# Patient Record
Sex: Female | Born: 1955 | Hispanic: No | Marital: Married | Smoking: Never smoker
Health system: Southern US, Community
[De-identification: ages and names within clinical notes are randomized; demographics above are authoritative.]

## PROBLEM LIST (undated history)

## (undated) DIAGNOSIS — E785 Hyperlipidemia, unspecified: Secondary | ICD-10-CM

## (undated) DIAGNOSIS — I10 Essential (primary) hypertension: Secondary | ICD-10-CM

## (undated) DIAGNOSIS — M549 Dorsalgia, unspecified: Secondary | ICD-10-CM

## (undated) DIAGNOSIS — R87629 Unspecified abnormal cytological findings in specimens from vagina: Secondary | ICD-10-CM

## (undated) DIAGNOSIS — F32A Depression, unspecified: Secondary | ICD-10-CM

## (undated) DIAGNOSIS — G8929 Other chronic pain: Secondary | ICD-10-CM

## (undated) DIAGNOSIS — F329 Major depressive disorder, single episode, unspecified: Secondary | ICD-10-CM

## (undated) HISTORY — PX: TUBAL LIGATION: SHX77

## (undated) HISTORY — DX: Hyperlipidemia, unspecified: E78.5

## (undated) HISTORY — PX: COLPOSCOPY: SHX161

## (undated) HISTORY — PX: THYROID CYST EXCISION: SHX2511

## (undated) HISTORY — DX: Unspecified abnormal cytological findings in specimens from vagina: R87.629

---

## 2003-12-30 ENCOUNTER — Emergency Department (HOSPITAL_COMMUNITY): Admission: EM | Admit: 2003-12-30 | Discharge: 2003-12-30 | Payer: Self-pay | Admitting: Emergency Medicine

## 2005-01-30 ENCOUNTER — Ambulatory Visit: Payer: Self-pay | Admitting: Family Medicine

## 2005-02-08 ENCOUNTER — Ambulatory Visit: Payer: Self-pay | Admitting: *Deleted

## 2005-03-13 ENCOUNTER — Ambulatory Visit: Payer: Self-pay | Admitting: Family Medicine

## 2005-03-29 ENCOUNTER — Ambulatory Visit: Payer: Self-pay | Admitting: Family Medicine

## 2005-06-29 ENCOUNTER — Emergency Department (HOSPITAL_COMMUNITY): Admission: EM | Admit: 2005-06-29 | Discharge: 2005-06-29 | Payer: Self-pay | Admitting: Emergency Medicine

## 2006-01-26 ENCOUNTER — Ambulatory Visit: Payer: Self-pay | Admitting: Family Medicine

## 2006-02-15 ENCOUNTER — Ambulatory Visit: Payer: Self-pay | Admitting: Family Medicine

## 2006-02-22 ENCOUNTER — Ambulatory Visit (HOSPITAL_COMMUNITY): Admission: RE | Admit: 2006-02-22 | Discharge: 2006-02-22 | Payer: Self-pay | Admitting: Family Medicine

## 2006-03-15 ENCOUNTER — Ambulatory Visit: Payer: Self-pay | Admitting: Family Medicine

## 2006-05-10 ENCOUNTER — Ambulatory Visit: Payer: Self-pay | Admitting: Family Medicine

## 2006-05-21 ENCOUNTER — Ambulatory Visit (HOSPITAL_COMMUNITY): Admission: RE | Admit: 2006-05-21 | Discharge: 2006-05-21 | Payer: Self-pay | Admitting: Internal Medicine

## 2006-06-15 ENCOUNTER — Ambulatory Visit (HOSPITAL_COMMUNITY): Admission: RE | Admit: 2006-06-15 | Discharge: 2006-06-15 | Payer: Self-pay | Admitting: Internal Medicine

## 2006-07-13 ENCOUNTER — Ambulatory Visit (HOSPITAL_COMMUNITY): Admission: RE | Admit: 2006-07-13 | Discharge: 2006-07-13 | Payer: Self-pay | Admitting: Internal Medicine

## 2006-09-19 ENCOUNTER — Emergency Department (HOSPITAL_COMMUNITY): Admission: EM | Admit: 2006-09-19 | Discharge: 2006-09-19 | Payer: Self-pay | Admitting: Family Medicine

## 2007-03-06 ENCOUNTER — Emergency Department (HOSPITAL_COMMUNITY): Admission: EM | Admit: 2007-03-06 | Discharge: 2007-03-06 | Payer: Self-pay | Admitting: Emergency Medicine

## 2007-06-05 ENCOUNTER — Emergency Department (HOSPITAL_COMMUNITY): Admission: EM | Admit: 2007-06-05 | Discharge: 2007-06-06 | Payer: Self-pay | Admitting: Emergency Medicine

## 2007-06-24 ENCOUNTER — Emergency Department (HOSPITAL_COMMUNITY): Admission: EM | Admit: 2007-06-24 | Discharge: 2007-06-24 | Payer: Self-pay | Admitting: Family Medicine

## 2007-07-04 ENCOUNTER — Ambulatory Visit: Payer: Self-pay | Admitting: Cardiovascular Disease

## 2007-07-16 ENCOUNTER — Ambulatory Visit: Payer: Self-pay

## 2007-07-16 ENCOUNTER — Ambulatory Visit: Payer: Self-pay | Admitting: Family Medicine

## 2007-07-16 ENCOUNTER — Encounter: Payer: Self-pay | Admitting: Cardiovascular Disease

## 2007-07-16 ENCOUNTER — Encounter (INDEPENDENT_AMBULATORY_CARE_PROVIDER_SITE_OTHER): Payer: Self-pay | Admitting: Family Medicine

## 2007-07-16 LAB — CONVERTED CEMR LAB
Chlamydia, DNA Probe: NEGATIVE
GC Probe Amp, Genital: NEGATIVE

## 2007-07-19 ENCOUNTER — Telehealth (INDEPENDENT_AMBULATORY_CARE_PROVIDER_SITE_OTHER): Payer: Self-pay | Admitting: Family Medicine

## 2007-07-19 DIAGNOSIS — I10 Essential (primary) hypertension: Secondary | ICD-10-CM

## 2007-07-19 DIAGNOSIS — E785 Hyperlipidemia, unspecified: Secondary | ICD-10-CM | POA: Insufficient documentation

## 2007-07-23 DIAGNOSIS — I10 Essential (primary) hypertension: Secondary | ICD-10-CM | POA: Insufficient documentation

## 2007-07-23 DIAGNOSIS — R0789 Other chest pain: Secondary | ICD-10-CM | POA: Insufficient documentation

## 2007-08-09 DIAGNOSIS — Z124 Encounter for screening for malignant neoplasm of cervix: Secondary | ICD-10-CM | POA: Insufficient documentation

## 2007-08-09 DIAGNOSIS — R87610 Atypical squamous cells of undetermined significance on cytologic smear of cervix (ASC-US): Secondary | ICD-10-CM

## 2007-08-29 ENCOUNTER — Emergency Department (HOSPITAL_COMMUNITY): Admission: EM | Admit: 2007-08-29 | Discharge: 2007-08-30 | Payer: Self-pay | Admitting: Emergency Medicine

## 2007-09-04 ENCOUNTER — Ambulatory Visit: Payer: Self-pay | Admitting: Family Medicine

## 2007-09-17 ENCOUNTER — Telehealth (INDEPENDENT_AMBULATORY_CARE_PROVIDER_SITE_OTHER): Payer: Self-pay | Admitting: *Deleted

## 2007-09-19 ENCOUNTER — Ambulatory Visit: Payer: Self-pay | Admitting: Obstetrics and Gynecology

## 2007-09-19 ENCOUNTER — Encounter (INDEPENDENT_AMBULATORY_CARE_PROVIDER_SITE_OTHER): Payer: Self-pay | Admitting: Family Medicine

## 2007-09-19 ENCOUNTER — Other Ambulatory Visit: Admission: RE | Admit: 2007-09-19 | Discharge: 2007-09-19 | Payer: Self-pay | Admitting: Obstetrics and Gynecology

## 2007-10-03 ENCOUNTER — Ambulatory Visit: Payer: Self-pay | Admitting: Obstetrics and Gynecology

## 2007-10-17 ENCOUNTER — Ambulatory Visit: Payer: Self-pay | Admitting: Obstetrics & Gynecology

## 2007-11-28 ENCOUNTER — Telehealth (INDEPENDENT_AMBULATORY_CARE_PROVIDER_SITE_OTHER): Payer: Self-pay | Admitting: *Deleted

## 2007-12-04 ENCOUNTER — Ambulatory Visit: Payer: Self-pay | Admitting: Family Medicine

## 2007-12-04 DIAGNOSIS — R51 Headache: Secondary | ICD-10-CM

## 2007-12-04 DIAGNOSIS — R519 Headache, unspecified: Secondary | ICD-10-CM | POA: Insufficient documentation

## 2008-03-18 ENCOUNTER — Ambulatory Visit: Payer: Self-pay | Admitting: Family Medicine

## 2008-03-18 ENCOUNTER — Telehealth (INDEPENDENT_AMBULATORY_CARE_PROVIDER_SITE_OTHER): Payer: Self-pay | Admitting: Family Medicine

## 2008-03-18 DIAGNOSIS — M545 Low back pain, unspecified: Secondary | ICD-10-CM | POA: Insufficient documentation

## 2008-03-24 ENCOUNTER — Encounter (INDEPENDENT_AMBULATORY_CARE_PROVIDER_SITE_OTHER): Payer: Self-pay | Admitting: Family Medicine

## 2008-04-09 ENCOUNTER — Encounter (INDEPENDENT_AMBULATORY_CARE_PROVIDER_SITE_OTHER): Payer: Self-pay | Admitting: Family Medicine

## 2008-04-09 ENCOUNTER — Ambulatory Visit: Payer: Self-pay | Admitting: Obstetrics and Gynecology

## 2008-04-09 ENCOUNTER — Encounter: Payer: Self-pay | Admitting: Obstetrics and Gynecology

## 2008-04-14 DIAGNOSIS — E210 Primary hyperparathyroidism: Secondary | ICD-10-CM | POA: Insufficient documentation

## 2008-05-22 ENCOUNTER — Telehealth (INDEPENDENT_AMBULATORY_CARE_PROVIDER_SITE_OTHER): Payer: Self-pay | Admitting: Family Medicine

## 2008-05-22 LAB — CONVERTED CEMR LAB
ALT: 16 units/L (ref 0–35)
AST: 16 units/L (ref 0–37)
Albumin: 4.4 g/dL (ref 3.5–5.2)
Alkaline Phosphatase: 84 units/L (ref 39–117)
BUN: 12 mg/dL (ref 6–23)
CO2: 24 meq/L (ref 19–32)
Calcium, Total (PTH): 11 mg/dL — ABNORMAL HIGH (ref 8.4–10.5)
Calcium: 11 mg/dL — ABNORMAL HIGH (ref 8.4–10.5)
Chloride: 106 meq/L (ref 96–112)
Creatinine, Ser: 0.85 mg/dL (ref 0.40–1.20)
Glucose, Bld: 95 mg/dL (ref 70–99)
PTH: 100.7 pg/mL — ABNORMAL HIGH (ref 14.0–72.0)
Potassium: 4.4 meq/L (ref 3.5–5.3)
Sodium: 139 meq/L (ref 135–145)
Total Bilirubin: 0.3 mg/dL (ref 0.3–1.2)
Total Protein: 7.4 g/dL (ref 6.0–8.3)
Vit D, 1,25-Dihydroxy: 22 — ABNORMAL LOW (ref 30–89)

## 2008-05-25 ENCOUNTER — Ambulatory Visit: Payer: Self-pay | Admitting: Family Medicine

## 2008-06-01 ENCOUNTER — Telehealth (INDEPENDENT_AMBULATORY_CARE_PROVIDER_SITE_OTHER): Payer: Self-pay | Admitting: *Deleted

## 2008-07-01 ENCOUNTER — Telehealth (INDEPENDENT_AMBULATORY_CARE_PROVIDER_SITE_OTHER): Payer: Self-pay | Admitting: *Deleted

## 2008-07-15 ENCOUNTER — Encounter (INDEPENDENT_AMBULATORY_CARE_PROVIDER_SITE_OTHER): Payer: Self-pay | Admitting: *Deleted

## 2008-07-16 ENCOUNTER — Emergency Department (HOSPITAL_COMMUNITY): Admission: EM | Admit: 2008-07-16 | Discharge: 2008-07-16 | Payer: Self-pay | Admitting: Emergency Medicine

## 2008-07-17 ENCOUNTER — Telehealth (INDEPENDENT_AMBULATORY_CARE_PROVIDER_SITE_OTHER): Payer: Self-pay | Admitting: *Deleted

## 2008-08-04 ENCOUNTER — Ambulatory Visit: Payer: Self-pay | Admitting: Family Medicine

## 2008-08-04 DIAGNOSIS — IMO0001 Reserved for inherently not codable concepts without codable children: Secondary | ICD-10-CM

## 2008-08-07 ENCOUNTER — Encounter (HOSPITAL_COMMUNITY): Admission: RE | Admit: 2008-08-07 | Discharge: 2008-08-07 | Payer: Self-pay | Admitting: Family Medicine

## 2008-08-10 ENCOUNTER — Telehealth (INDEPENDENT_AMBULATORY_CARE_PROVIDER_SITE_OTHER): Payer: Self-pay | Admitting: Family Medicine

## 2008-08-10 LAB — CONVERTED CEMR LAB
ALT: 17 units/L (ref 0–35)
AST: 17 units/L (ref 0–37)
Albumin: 4.6 g/dL (ref 3.5–5.2)
Alkaline Phosphatase: 77 units/L (ref 39–117)
BUN: 10 mg/dL (ref 6–23)
CO2: 23 meq/L (ref 19–32)
Calcium, Total (PTH): 11.1 mg/dL — ABNORMAL HIGH (ref 8.4–10.5)
Calcium: 11.1 mg/dL — ABNORMAL HIGH (ref 8.4–10.5)
Chloride: 107 meq/L (ref 96–112)
Creatinine, Ser: 0.64 mg/dL (ref 0.40–1.20)
Glucose, Bld: 74 mg/dL (ref 70–99)
Magnesium: 2.4 mg/dL (ref 1.5–2.5)
PTH: 131.5 pg/mL — ABNORMAL HIGH (ref 14.0–72.0)
Phosphorus: 2.7 mg/dL (ref 2.3–4.6)
Potassium: 4.9 meq/L (ref 3.5–5.3)
Sodium: 139 meq/L (ref 135–145)
Total Bilirubin: 0.5 mg/dL (ref 0.3–1.2)
Total Protein: 7.8 g/dL (ref 6.0–8.3)
Vit D, 1,25-Dihydroxy: 26 — ABNORMAL LOW (ref 30–89)

## 2008-08-16 DIAGNOSIS — F332 Major depressive disorder, recurrent severe without psychotic features: Secondary | ICD-10-CM | POA: Insufficient documentation

## 2008-08-19 ENCOUNTER — Ambulatory Visit: Payer: Self-pay | Admitting: Family Medicine

## 2008-09-09 ENCOUNTER — Encounter (INDEPENDENT_AMBULATORY_CARE_PROVIDER_SITE_OTHER): Payer: Self-pay | Admitting: Family Medicine

## 2008-10-21 ENCOUNTER — Ambulatory Visit: Payer: Self-pay | Admitting: Family Medicine

## 2008-10-28 ENCOUNTER — Telehealth (INDEPENDENT_AMBULATORY_CARE_PROVIDER_SITE_OTHER): Payer: Self-pay | Admitting: *Deleted

## 2008-11-04 ENCOUNTER — Ambulatory Visit: Payer: Self-pay | Admitting: Family Medicine

## 2008-11-04 DIAGNOSIS — R1032 Left lower quadrant pain: Secondary | ICD-10-CM

## 2008-11-05 ENCOUNTER — Encounter (INDEPENDENT_AMBULATORY_CARE_PROVIDER_SITE_OTHER): Payer: Self-pay | Admitting: Family Medicine

## 2008-11-10 ENCOUNTER — Ambulatory Visit (HOSPITAL_COMMUNITY): Admission: RE | Admit: 2008-11-10 | Discharge: 2008-11-10 | Payer: Self-pay | Admitting: Family Medicine

## 2009-02-18 ENCOUNTER — Ambulatory Visit: Payer: Self-pay | Admitting: Internal Medicine

## 2009-02-18 ENCOUNTER — Telehealth (INDEPENDENT_AMBULATORY_CARE_PROVIDER_SITE_OTHER): Payer: Self-pay | Admitting: Internal Medicine

## 2009-02-18 DIAGNOSIS — M25519 Pain in unspecified shoulder: Secondary | ICD-10-CM

## 2009-02-22 ENCOUNTER — Ambulatory Visit (HOSPITAL_COMMUNITY): Admission: RE | Admit: 2009-02-22 | Discharge: 2009-02-22 | Payer: Self-pay | Admitting: Internal Medicine

## 2009-02-24 ENCOUNTER — Encounter (INDEPENDENT_AMBULATORY_CARE_PROVIDER_SITE_OTHER): Payer: Self-pay | Admitting: Internal Medicine

## 2009-03-01 ENCOUNTER — Encounter (INDEPENDENT_AMBULATORY_CARE_PROVIDER_SITE_OTHER): Payer: Self-pay | Admitting: *Deleted

## 2009-03-05 ENCOUNTER — Encounter: Admission: RE | Admit: 2009-03-05 | Discharge: 2009-03-11 | Payer: Self-pay | Admitting: Internal Medicine

## 2009-03-05 ENCOUNTER — Encounter (INDEPENDENT_AMBULATORY_CARE_PROVIDER_SITE_OTHER): Payer: Self-pay | Admitting: Internal Medicine

## 2009-03-08 ENCOUNTER — Telehealth (INDEPENDENT_AMBULATORY_CARE_PROVIDER_SITE_OTHER): Payer: Self-pay | Admitting: Internal Medicine

## 2009-03-11 ENCOUNTER — Encounter (INDEPENDENT_AMBULATORY_CARE_PROVIDER_SITE_OTHER): Payer: Self-pay | Admitting: Surgery

## 2009-03-11 ENCOUNTER — Ambulatory Visit (HOSPITAL_COMMUNITY): Admission: RE | Admit: 2009-03-11 | Discharge: 2009-03-12 | Payer: Self-pay | Admitting: Surgery

## 2009-03-25 ENCOUNTER — Ambulatory Visit: Payer: Self-pay | Admitting: Internal Medicine

## 2009-04-07 ENCOUNTER — Encounter (INDEPENDENT_AMBULATORY_CARE_PROVIDER_SITE_OTHER): Payer: Self-pay | Admitting: Internal Medicine

## 2009-04-09 ENCOUNTER — Telehealth (INDEPENDENT_AMBULATORY_CARE_PROVIDER_SITE_OTHER): Payer: Self-pay | Admitting: *Deleted

## 2009-04-09 ENCOUNTER — Encounter (INDEPENDENT_AMBULATORY_CARE_PROVIDER_SITE_OTHER): Payer: Self-pay | Admitting: Internal Medicine

## 2009-04-27 ENCOUNTER — Encounter: Admission: RE | Admit: 2009-04-27 | Discharge: 2009-05-11 | Payer: Self-pay | Admitting: Internal Medicine

## 2009-04-30 ENCOUNTER — Ambulatory Visit: Payer: Self-pay | Admitting: Internal Medicine

## 2009-04-30 LAB — CONVERTED CEMR LAB
ALT: 18 units/L (ref 0–35)
Albumin: 4.6 g/dL (ref 3.5–5.2)
Alkaline Phosphatase: 61 units/L (ref 39–117)
Basophils Absolute: 0 10*3/uL (ref 0.0–0.1)
CO2: 22 meq/L (ref 19–32)
Eosinophils Relative: 1 % (ref 0–5)
Glucose, Bld: 82 mg/dL (ref 70–99)
HCT: 39.4 % (ref 36.0–46.0)
Hemoglobin: 13.1 g/dL (ref 12.0–15.0)
Lymphocytes Relative: 43 % (ref 12–46)
Lymphs Abs: 2.6 10*3/uL (ref 0.7–4.0)
Monocytes Absolute: 0.5 10*3/uL (ref 0.1–1.0)
Neutro Abs: 3 10*3/uL (ref 1.7–7.7)
Potassium: 4.3 meq/L (ref 3.5–5.3)
RBC: 4.5 M/uL (ref 3.87–5.11)
RDW: 14.4 % (ref 11.5–15.5)
Sed Rate: 10 mm/hr (ref 0–22)
Sodium: 142 meq/L (ref 135–145)
Total Bilirubin: 0.4 mg/dL (ref 0.3–1.2)
Total Protein: 7.8 g/dL (ref 6.0–8.3)
WBC: 6.1 10*3/uL (ref 4.0–10.5)

## 2009-05-06 ENCOUNTER — Encounter (INDEPENDENT_AMBULATORY_CARE_PROVIDER_SITE_OTHER): Payer: Self-pay | Admitting: Internal Medicine

## 2009-05-12 ENCOUNTER — Telehealth (INDEPENDENT_AMBULATORY_CARE_PROVIDER_SITE_OTHER): Payer: Self-pay | Admitting: Internal Medicine

## 2009-05-12 ENCOUNTER — Encounter (INDEPENDENT_AMBULATORY_CARE_PROVIDER_SITE_OTHER): Payer: Self-pay | Admitting: Internal Medicine

## 2009-05-19 ENCOUNTER — Encounter (INDEPENDENT_AMBULATORY_CARE_PROVIDER_SITE_OTHER): Payer: Self-pay | Admitting: Internal Medicine

## 2009-05-20 ENCOUNTER — Telehealth (INDEPENDENT_AMBULATORY_CARE_PROVIDER_SITE_OTHER): Payer: Self-pay | Admitting: Internal Medicine

## 2009-05-26 ENCOUNTER — Emergency Department (HOSPITAL_COMMUNITY): Admission: EM | Admit: 2009-05-26 | Discharge: 2009-05-27 | Payer: Self-pay | Admitting: Emergency Medicine

## 2009-05-28 ENCOUNTER — Encounter (INDEPENDENT_AMBULATORY_CARE_PROVIDER_SITE_OTHER): Payer: Self-pay | Admitting: Internal Medicine

## 2009-06-01 ENCOUNTER — Ambulatory Visit: Payer: Self-pay | Admitting: Internal Medicine

## 2009-06-03 ENCOUNTER — Ambulatory Visit: Payer: Self-pay | Admitting: Obstetrics and Gynecology

## 2009-06-03 ENCOUNTER — Encounter: Payer: Self-pay | Admitting: Family

## 2009-06-04 ENCOUNTER — Encounter (INDEPENDENT_AMBULATORY_CARE_PROVIDER_SITE_OTHER): Payer: Self-pay | Admitting: Internal Medicine

## 2009-06-07 ENCOUNTER — Emergency Department (HOSPITAL_COMMUNITY): Admission: EM | Admit: 2009-06-07 | Discharge: 2009-06-07 | Payer: Self-pay | Admitting: Emergency Medicine

## 2009-08-11 ENCOUNTER — Telehealth (INDEPENDENT_AMBULATORY_CARE_PROVIDER_SITE_OTHER): Payer: Self-pay | Admitting: *Deleted

## 2009-10-08 ENCOUNTER — Ambulatory Visit: Payer: Self-pay | Admitting: Internal Medicine

## 2009-10-08 DIAGNOSIS — G5600 Carpal tunnel syndrome, unspecified upper limb: Secondary | ICD-10-CM | POA: Insufficient documentation

## 2009-10-24 ENCOUNTER — Encounter (INDEPENDENT_AMBULATORY_CARE_PROVIDER_SITE_OTHER): Payer: Self-pay | Admitting: Internal Medicine

## 2009-11-09 ENCOUNTER — Encounter: Admission: RE | Admit: 2009-11-09 | Discharge: 2009-11-09 | Payer: Self-pay | Admitting: Internal Medicine

## 2009-12-01 IMAGING — CT CT PELVIS W/ CM
1 of 2 series · 15 of 32 positions shown, 19 images · IV contrast (agent unspecified)
Comparison: None.

CT ABDOMEN

CLINICAL DATA: Abdominal pain.

CT ABDOMEN AND PELVIS WITH CONTRAST
TECHNIQUE: Multidetector CT imaging of the abdomen and pelvis was
performed using the standard protocol following bolus
administration of intravenous contrast.
Contrast: 100 ml 4mnipaque-CDD

[Series 2: rtn ap with st · axial · 0.71mm/px · z∈[+808,+1228]mm · 15 of 93 slices shown, 19 images]
[im 5/93  soft-tissue]
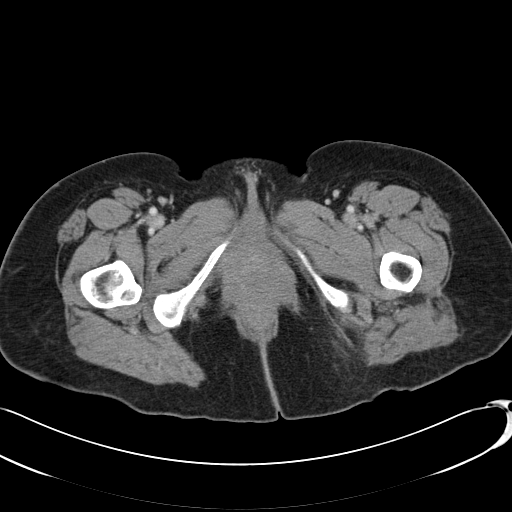
[im 5/93  bone]
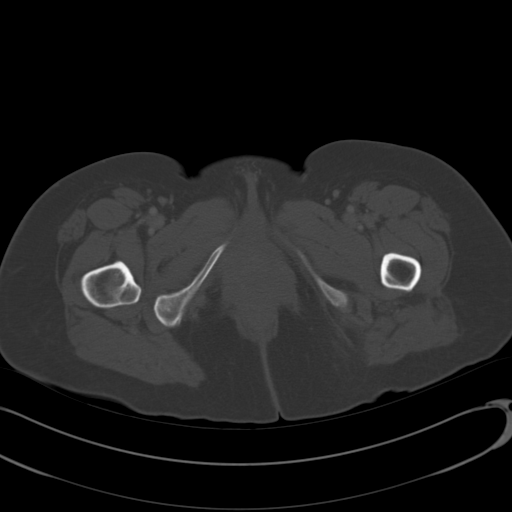
[im 13/93  soft-tissue]
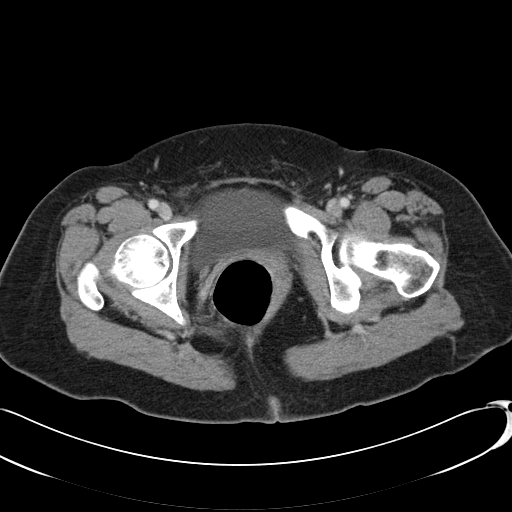
[im 21/93  soft-tissue]
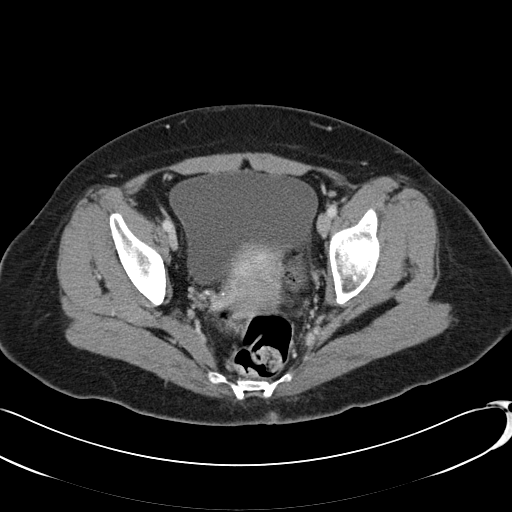
[im 25/93  soft-tissue]
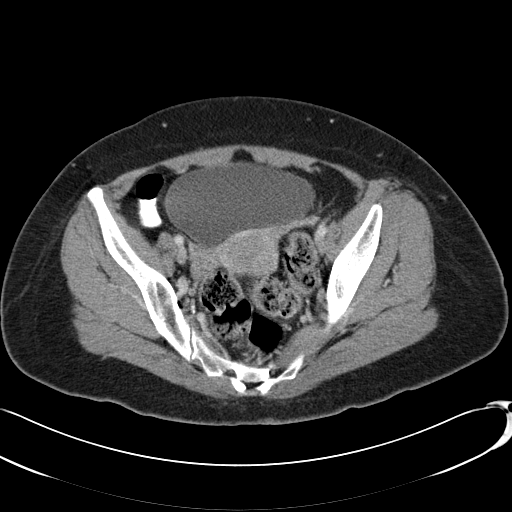
[im 33/93  soft-tissue]
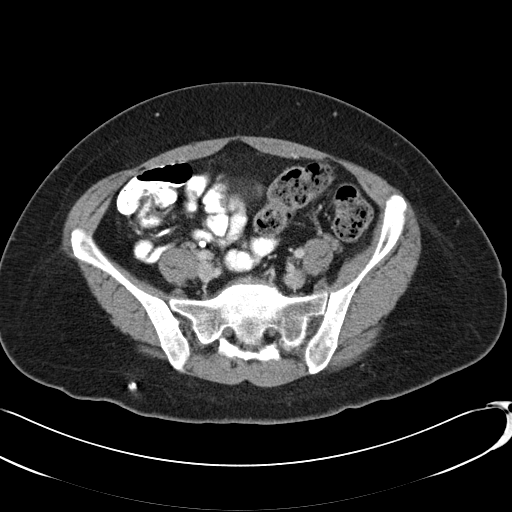
[im 41/93  soft-tissue]
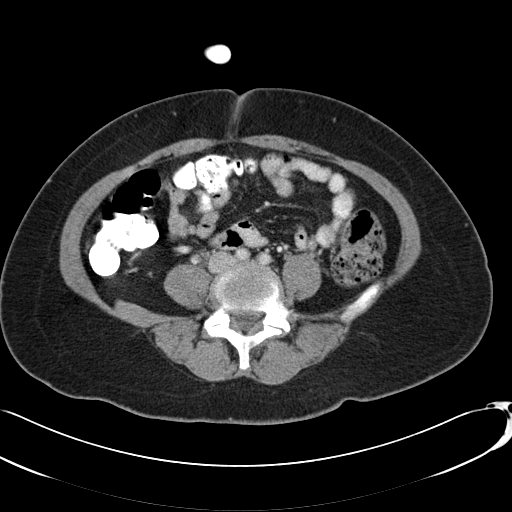
[im 49/93  soft-tissue]
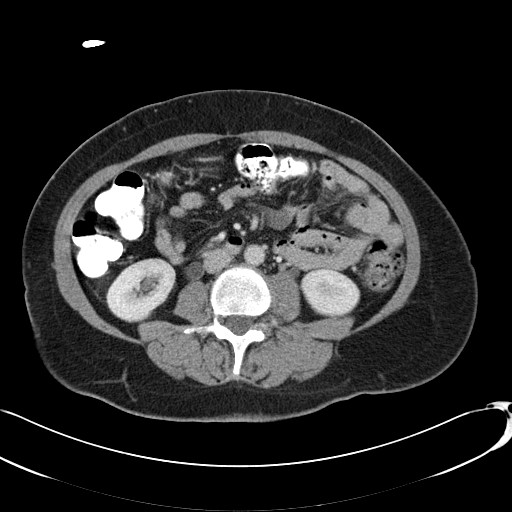
[im 53/93  soft-tissue]
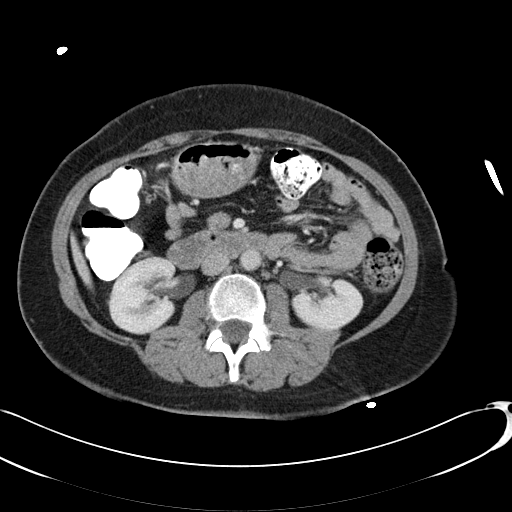
[im 61/93  soft-tissue]
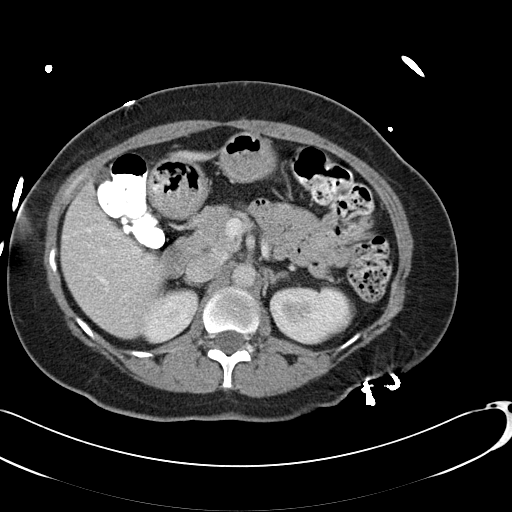
[im 61/93  bone]
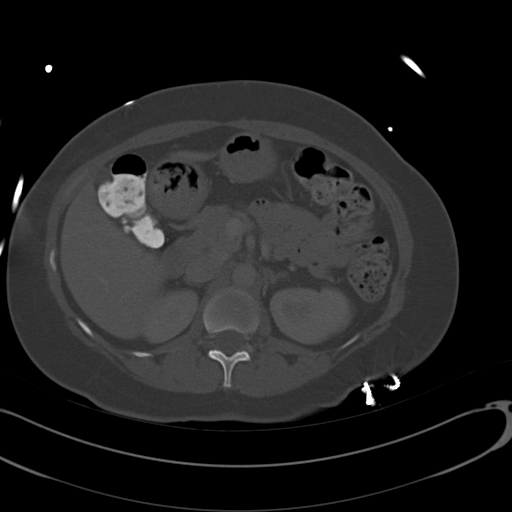
[im 69/93  soft-tissue]
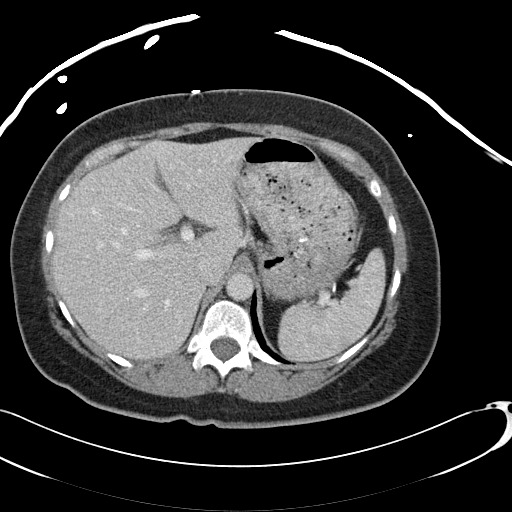
[im 73/93  soft-tissue]
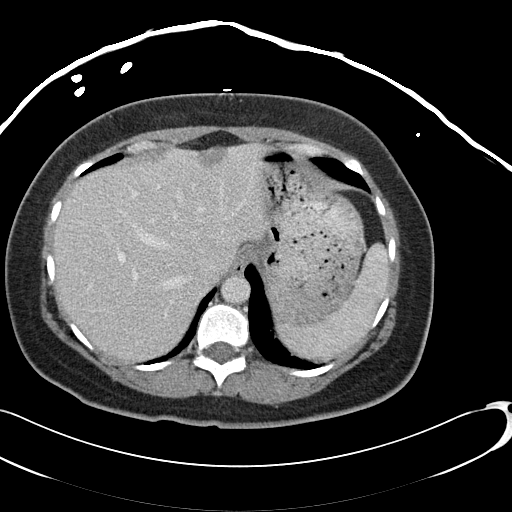
[im 77/93  lung]
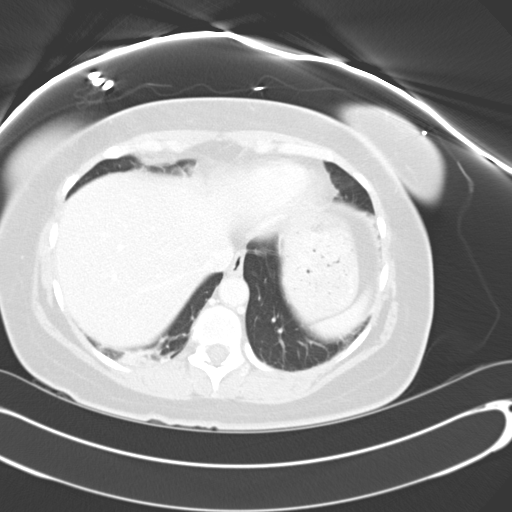
[im 81/93  soft-tissue]
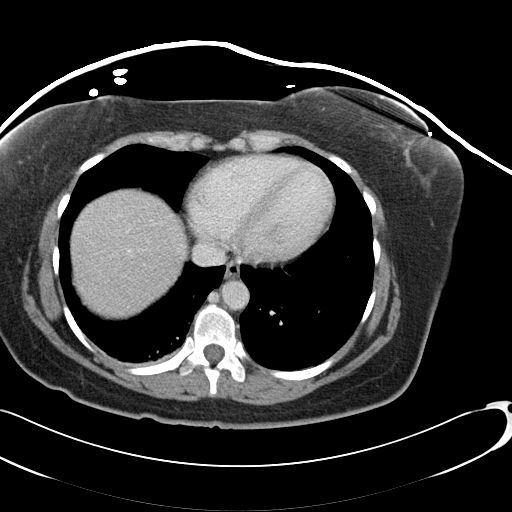
[im 81/93  lung]
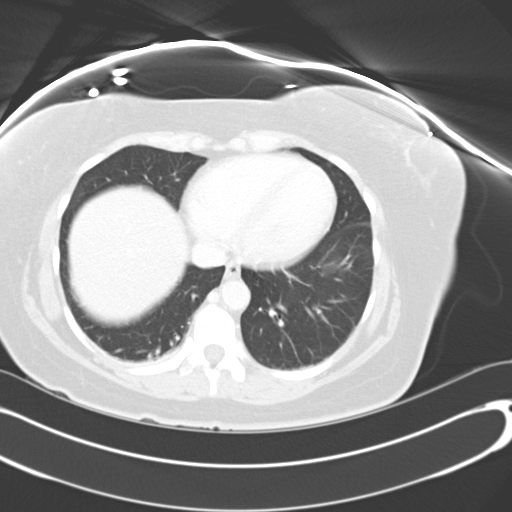
[im 85/93  lung]
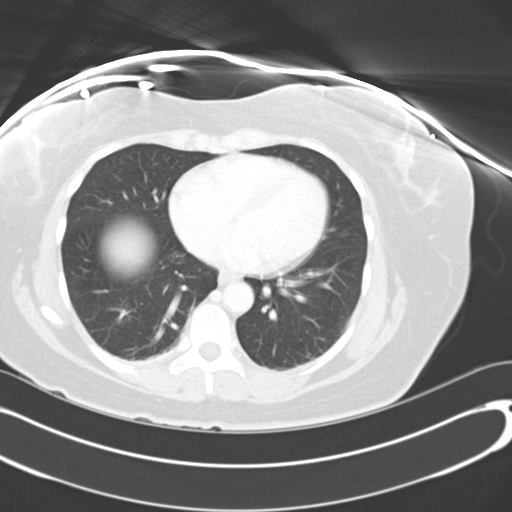
[im 89/93  soft-tissue]
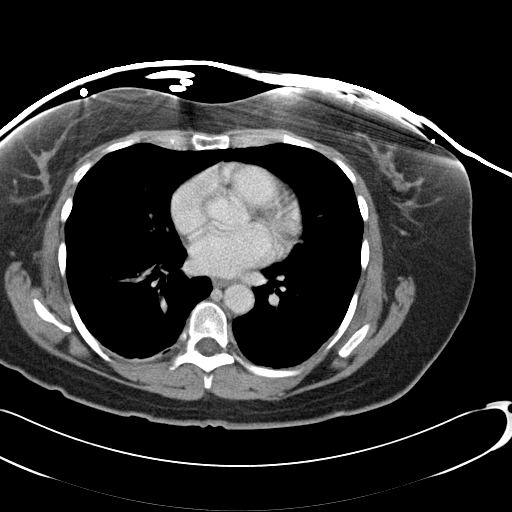
[im 89/93  lung]
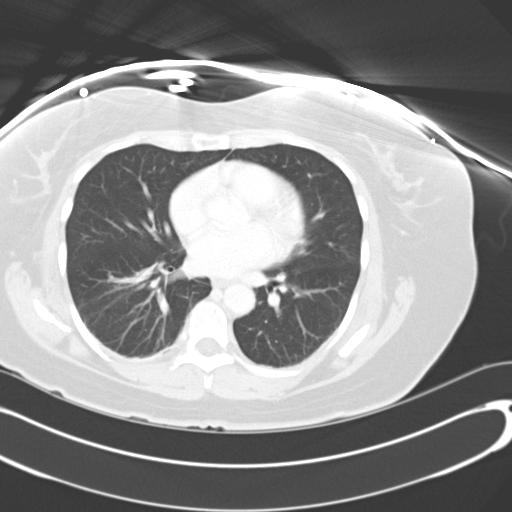

[15 of 32 positions shown; findings below may reference images not displayed]

FINDINGS: 2.0 cm low density lesion in the anterior left liver has
attenuation too high allow characterization as a simple cyst.  No
focal abnormalities noted in the spleen.  The stomach, duodenum,
pancreas, gallbladder, adrenal glands, and kidneys are
unremarkable.  7 mm splenic artery aneurysm is identified in the
splenic hilum.

No abdominal aortic aneurysm.  No abdominal lymphadenopathy.  No
intraperitoneal free fluid.  The abdominal bowel loops are
unremarkable.
IMPRESSION: 2 cm lesion in the anterior left liver is not a simple cyst.  This
may be a cavernous hemangioma or complex cyst.  Dedicated liver MRI
without and with contrast would be the study of choice to further
evaluate.

CT PELVIS
FINDINGS: Bladder is distended.  There is no intraperitoneal free
fluid.  No pelvic lymphadenopathy.  Uterus is unremarkable.  There
is no adnexal mass.

The terminal ileum is normal.  The appendix is normal.

Bone windows reveal no worrisome lytic or sclerotic osseous
lesions.
IMPRESSION: No acute findings in the pelvis.

## 2009-12-07 ENCOUNTER — Ambulatory Visit: Payer: Self-pay | Admitting: Internal Medicine

## 2009-12-10 ENCOUNTER — Telehealth (INDEPENDENT_AMBULATORY_CARE_PROVIDER_SITE_OTHER): Payer: Self-pay | Admitting: *Deleted

## 2009-12-13 ENCOUNTER — Telehealth (INDEPENDENT_AMBULATORY_CARE_PROVIDER_SITE_OTHER): Payer: Self-pay | Admitting: Internal Medicine

## 2009-12-16 ENCOUNTER — Encounter: Admission: RE | Admit: 2009-12-16 | Discharge: 2009-12-16 | Payer: Self-pay | Admitting: Internal Medicine

## 2009-12-20 ENCOUNTER — Encounter (INDEPENDENT_AMBULATORY_CARE_PROVIDER_SITE_OTHER): Payer: Self-pay | Admitting: Internal Medicine

## 2010-01-01 ENCOUNTER — Emergency Department (HOSPITAL_COMMUNITY): Admission: EM | Admit: 2010-01-01 | Discharge: 2010-01-02 | Payer: Self-pay | Admitting: Emergency Medicine

## 2010-01-12 ENCOUNTER — Inpatient Hospital Stay (HOSPITAL_COMMUNITY): Admission: AD | Admit: 2010-01-12 | Discharge: 2010-01-12 | Payer: Self-pay | Admitting: Obstetrics & Gynecology

## 2010-01-12 ENCOUNTER — Ambulatory Visit: Payer: Self-pay | Admitting: Advanced Practice Midwife

## 2010-01-13 ENCOUNTER — Ambulatory Visit: Payer: Self-pay | Admitting: Internal Medicine

## 2010-01-13 ENCOUNTER — Telehealth (INDEPENDENT_AMBULATORY_CARE_PROVIDER_SITE_OTHER): Payer: Self-pay | Admitting: Internal Medicine

## 2010-01-14 ENCOUNTER — Encounter (INDEPENDENT_AMBULATORY_CARE_PROVIDER_SITE_OTHER): Payer: Self-pay | Admitting: Internal Medicine

## 2010-01-18 ENCOUNTER — Encounter (INDEPENDENT_AMBULATORY_CARE_PROVIDER_SITE_OTHER): Payer: Self-pay | Admitting: Internal Medicine

## 2010-01-20 ENCOUNTER — Ambulatory Visit: Payer: Self-pay | Admitting: Internal Medicine

## 2010-01-21 ENCOUNTER — Ambulatory Visit: Payer: Self-pay | Admitting: Obstetrics & Gynecology

## 2010-01-24 ENCOUNTER — Telehealth (INDEPENDENT_AMBULATORY_CARE_PROVIDER_SITE_OTHER): Payer: Self-pay | Admitting: Internal Medicine

## 2010-04-18 ENCOUNTER — Encounter (INDEPENDENT_AMBULATORY_CARE_PROVIDER_SITE_OTHER): Payer: Self-pay | Admitting: Internal Medicine

## 2010-05-13 ENCOUNTER — Telehealth (INDEPENDENT_AMBULATORY_CARE_PROVIDER_SITE_OTHER): Payer: Self-pay | Admitting: Internal Medicine

## 2010-05-13 ENCOUNTER — Ambulatory Visit: Payer: Self-pay | Admitting: Internal Medicine

## 2010-05-13 LAB — CONVERTED CEMR LAB
Alkaline Phosphatase: 33 units/L — ABNORMAL LOW (ref 39–117)
BUN: 11 mg/dL (ref 6–23)
CO2: 23 meq/L (ref 19–32)
Creatinine, Ser: 0.75 mg/dL (ref 0.40–1.20)
Glucose, Bld: 77 mg/dL (ref 70–99)
Ketones, urine, test strip: NEGATIVE
Protein, U semiquant: NEGATIVE
Specific Gravity, Urine: 1.025
Total Bilirubin: 0.4 mg/dL (ref 0.3–1.2)
Urobilinogen, UA: 0.2
pH: 5.5

## 2010-06-03 ENCOUNTER — Encounter (INDEPENDENT_AMBULATORY_CARE_PROVIDER_SITE_OTHER): Payer: Self-pay | Admitting: Internal Medicine

## 2010-06-17 ENCOUNTER — Ambulatory Visit: Payer: Self-pay | Admitting: Internal Medicine

## 2010-06-27 ENCOUNTER — Encounter (INDEPENDENT_AMBULATORY_CARE_PROVIDER_SITE_OTHER): Payer: Self-pay | Admitting: Internal Medicine

## 2010-07-07 ENCOUNTER — Telehealth (INDEPENDENT_AMBULATORY_CARE_PROVIDER_SITE_OTHER): Payer: Self-pay | Admitting: Internal Medicine

## 2010-07-07 ENCOUNTER — Emergency Department (HOSPITAL_COMMUNITY): Admission: EM | Admit: 2010-07-07 | Discharge: 2009-11-23 | Payer: Self-pay | Admitting: Emergency Medicine

## 2010-07-11 ENCOUNTER — Encounter (INDEPENDENT_AMBULATORY_CARE_PROVIDER_SITE_OTHER): Payer: Self-pay | Admitting: Internal Medicine

## 2010-07-14 ENCOUNTER — Ambulatory Visit: Payer: Self-pay | Admitting: Internal Medicine

## 2010-07-20 ENCOUNTER — Encounter (INDEPENDENT_AMBULATORY_CARE_PROVIDER_SITE_OTHER): Payer: Self-pay | Admitting: Internal Medicine

## 2010-08-21 ENCOUNTER — Encounter: Payer: Self-pay | Admitting: Surgery

## 2010-08-21 ENCOUNTER — Encounter: Payer: Self-pay | Admitting: Family Medicine

## 2010-08-21 ENCOUNTER — Encounter: Payer: Self-pay | Admitting: Occupational Therapy

## 2010-08-21 ENCOUNTER — Encounter: Payer: Self-pay | Admitting: Internal Medicine

## 2010-08-30 NOTE — Letter (Signed)
Summary: REFERRAL FOR [facility]//DENIED  REFERRAL FOR [facility]//DENIED   Imported By: Arta Bruce 09/27/2009 10:53:53  _____________________________________________________________________  External Attachment:    Type:   Image     Comment:   External Document

## 2010-08-30 NOTE — Progress Notes (Signed)
Summary: [facility] neurology referral  Phone Note Outgoing Call   Summary of Call: Nora--see previous order, letters, need for copies of MRI, Xray reports to be sent along for referral to [facility] Neurology.  May need to get copies of Xrays from [place] as her paper chart is likely gone now.  Let me know if you need me to pick those out--anything regarding hip, back or leg  Thanks Initial call taken by: Julieanne Manson MD,  May 13, 2010 11:13 AM  Follow-up for Phone Call        Dr Delrae Alfred myu E-chart don't let me print can't you printed for me thank you .Marland KitchenCheryll Dessert  May 17, 2010 5:13 PM Don't worry about I printed . I send the referral to [facility] Neurosurgery waiting for an appt   Follow-up by: Cheryll Dessert,  May 19, 2010 12:48 PM

## 2010-08-30 NOTE — Progress Notes (Signed)
Summary: refills requesting  Phone Note Refill Request Call back at [phone] Message from:  Patient on January 25, 2010 3:51 PM  Refills Requested: Medication #1:  LISINOPRIL 10 MG  TABS take one tablet once daily  Method Requested: Fax to Local Pharmacy Initial call taken by: [place],  January 25, 2010 3:51 PM Summary of Call: The pt needs more refills from lisinopril medication [facility] pharmacy).  Please let her know when is ready. Maryuri Warnke MD Initial call taken by: Manon Hilding,  January 24, 2010 2:40 PM  Follow-up for Phone Call        Pt has refills left through November 2011.  Arna Medici will explain to her to call the pharmacy. Follow-up by: Vesta Mixer CMA,  January 26, 2010 4:15 PM  Additional Follow-up for Phone Call Additional follow up Details #1::        Spoke with [facility], they said they did not have the prescription, will send in another one. Additional Follow-up by: Vesta Mixer CMA,  January 26, 2010 4:23 PM

## 2010-08-30 NOTE — Letter (Signed)
Summary: *Referral Letter  [facility]  [address] [place], [place] [address]   Phone: [phone]  Fax: [phone]    10/24/2009  Thank you in advance for agreeing to see my patient:  Melissa Jones [address] [place], [place]  [address]  Phone: [phone]  Reason for Referral: Pt. with left low back pain, perhaps at times with radicular left leg pain at times that has not responded to PT, muscle relaxants or pain meds.  She at times appears to be in significant pain--so much so that PT stopped her treatments.  MRI 02/22/09 showed minimal bilateral foraminal stenoses at L4-5 and L5-S1.  I was hoping one of the interventional radiologists could take a look at the MRI and pt. and ascertain whether she might benefit from corticosteroid injection.      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) 7)  PRIMARY HYPERPARATHYROIDISM (ICD-252.01) 8)  BACK PAIN, LUMBAR (ICD-724.2) 9)  HYPERCALCEMIA (ICD-275.42) 10)  HEADACHE, REBOUND (ICD-784.0) 11)  PAP SMEAR, ABNORMAL, ASCUS (ICD-795.01) 12)  SCREENING FOR MALIGNANT NEOPLASM, CERVIX (ICD-V76.2) 13)  CHEST PAIN, ATYPICAL (ICD-786.59) 14)  HYPERTENSION (ICD-401.9) 15)  HYPERLIPIDEMIA (ICD-272.4) 16)  ESSENTIAL HYPERTENSION, BENIGN (ICD-401.1) 17)  EXAMINATION, ROUTINE MEDICAL (ICD-V70.0)   Current Medications: 1)  LISINOPRIL 10 MG  TABS (LISINOPRIL) take one tablet once daily 2)  ZOLOFT 50 MG TABS (SERTRALINE HCL) 1/2 tab by mouth daily for 7 days, then 1 tab by mouth daily thereafter. 3)  CYCLOBENZAPRINE HCL 10 MG TABS (CYCLOBENZAPRINE HCL) 1 by mouth q 8hour as needed 4)  DICLOFENAC SODIUM 75 MG TBEC (DICLOFENAC SODIUM) 1 tab by mouth two times a day with meals.  [facility]  Rx   Past Medical History: 1)  Hypertension 2)  Atypical Chest Pain;evaluated by cards, Dr. Eden Emms 12/08. 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  Sincerely,  Julieanne Manson MD

## 2010-08-30 NOTE — Progress Notes (Signed)
Summary: letter request  Phone Note Call from Patient Call back at [facility] Phone [phone] Call back at [phone]   Summary of Call: The pt wants the provider write down a letter that states the type of medical condition she has and the medication she is taking.  Also, the letter should states the reason unable her to work.   and ther reason why she is taking steroids.  Mulberry Md Initial call taken by: Manon Hilding,  Dec 10, 2009 9:35 AM  Follow-up for Phone Call        fwd to Dr. Delrae Alfred for review. Follow-up by: Vesta Mixer CMA,  Dec 10, 2009 4:27 PM  Additional Follow-up for Phone Call Additional follow up Details #1::        The pt wants to know if the provider can write down a letter that states which medication she is taking and why she is taking steroids.  The letter should explain why she is unable to work.Manon Hilding  Dec 14, 2009 3:28 PM    Additional Follow-up for Phone Call Additional follow up Details #2::    Please inform pt. she is not on steroids.   Need to know to whom the letter is being addressed before writing.  Julieanne Manson MD  Dec 15, 2009 8:28 AM   Left message on answering machine to return call. Dutch Quint RN  Dec 15, 2009 10:25 AM  The  pt will call back because she needs to get the address and name of the person whom the letter should be.  The  pt states that she has the first steroid injection on April 12  and the pt still waiting for the 2nd and 3rd injection.  According to the pt, she went to [facility] to get the injection contact office [phone].Manon Hilding  Dec 15, 2009 3:15 PM    Additional Follow-up for Phone Call Additional follow up Details #3:: Details for Additional Follow-up Action Taken: The pt just called and she informed that she doesn't need the letter because she had her second shot at [facility].  The only things she needs from the provider is the list of the medications and why she is taking the  medication.  The reason why she needs a temporal disabilty letter because she cannot work at this moment but she hope in the future the pain goes away but in the meantime she needs help because she cannot work at this moment from [employer] or something else.Manon Hilding  Dec 17, 2009 12:41 PM [facility] MD Letter written.  Please find out where it needs to be sent.  Julieanne Manson MD  Dec 20, 2009 8:29 AM   Pt do not know the names of the people that she needs to send the letter but she plans to give out the letter to the Social Service Department and the Disability Department just  to know if she can receive some kind of assistant   Please let her know as soon the leter is ready.   You can reach her back at [phone].Manon Hilding  Dec 23, 2009 11:05 AM I am not been successful to reach the pt so we need to sent a letter by mail.Manon Hilding  Dec 28, 2009 4:30 PM Pt came and picked up her letter.Manon Hilding  December 30, 2009 2:05 PM

## 2010-08-30 NOTE — Progress Notes (Signed)
Summary: pain med refill(notify pt)  Phone Note Call from Patient Call back at [facility] Phone [phone]   Summary of Call: pt needs more medical refills from the oxycodone and vicodin medications. [place] MD Initial call taken by: Manon Hilding,  August 11, 2009 2:40 PM  Follow-up for Phone Call        Pt last got Vicodin #60 on 04/30/09 and Oxycodone #60 on 06/01/09. Follow-up by: Vesta Mixer CMA,  August 11, 2009 3:48 PM  Additional Follow-up for Phone Call Additional follow up Details #1::        What has she been doing for pain meds since last fill?  Additional Follow-up by: Julieanne Manson MD,  August 15, 2009 11:08 PM    Additional Follow-up for Phone Call Additional follow up Details #2::    No Answer at [phone]. Follow-up by: Vesta Mixer CMA,  August 16, 2009 11:35 AM  Additional Follow-up for Phone Call Additional follow up Details #3:: Details for Additional Follow-up Action Taken: left msg with man and on voice mail for pt to call................... Tiffany McCoy CMA  August 23, 2009 11:40 AM   Pt called back and says she has been taking  tylenol extra strength and a friends diclofenac.  She says it helped with her pain  I would be happy to refill the Diclofenac.  Let her know faxed to pharmacy  Julieanne Manson MD  August 24, 2009 6:26 PM  Received fax back from pharmacy that we do not carry generic Diclofenac, but can get her set up with Arthrotec, which is a combination medicine that contains Diclofenac and another med that helps protect from the stomach side effects of Diclofenac.  She will need to take it two times a day as needed--to take with food.  She will need to go into the pharmacy to fill out ICP paperwork for it.  She will need to come in 1 month after she starts for a CMET ( V58.69)  Please check with pharmacy to see when they will be able to fill the med and schedule accordingly.  ****Ashby Dawes, Contact pt and let her know rx for  diclofenac had been sent to the pharmacy...............Marland KitchenMikey College CMA  August 26, 2009 4:51 PM   Pt aware of the message.Manon Hilding  August 31, 2009 4:47 PM     New/Updated Medications: DICLOFENAC SODIUM 75 MG TBEC (DICLOFENAC SODIUM) 1 tab  by mouth two times a day with food ARTHROTEC 75 75-200 MG-MCG TABS (DICLOFENAC-MISOPROSTOL) 1 tab by mouth two times a day with food Prescriptions: ARTHROTEC 75 75-200 MG-MCG TABS (DICLOFENAC-MISOPROSTOL) 1 tab by mouth two times a day with food  #60 x 2   Entered and Authorized by:   Julieanne Manson MD   Signed by:   Julieanne Manson MD on 08/30/2009   Method used:   Faxed to ...       [facility] (retail)       [address] [place], [place]  [address]       Ph: [phone]       Fax: [phone]   RxID:   [id number] DICLOFENAC SODIUM 75 MG TBEC (DICLOFENAC SODIUM) 1 tab  by mouth two times a day with food  #60 x 4   Entered and Authorized by:   Julieanne Manson MD   Signed by:   Julieanne Manson MD on 08/24/2009   Method used:   Faxed to .Marland KitchenMarland Kitchen  [facility] (retail)       [address] [place], [place]  [address]       Ph: [phone]       Fax: [phone]   RxID:   [id number]

## 2010-08-30 NOTE — Letter (Signed)
Summary: [facility]  [facility]   Imported By: Arta Bruce 07/01/2010 09:44:06  _____________________________________________________________________  External Attachment:    Type:   Image     Comment:   External Document

## 2010-08-30 NOTE — Assessment & Plan Note (Signed)
Summary: 3 MONTH F/U BACK PAIN/DEPRESSION///BC   Vital Signs:  Patient profile:   55 year old female Weight:      142.9 pounds Temp:     97.0 degrees F Pulse rate:   80 / minute Pulse rhythm:   regular Resp:     16 per minute BP sitting:   132 / 90  (left arm) Cuff size:   regular  Vitals Entered By: Michelle Nasuti (May 13, 2010 9:59 AM) CC: BACK PAIN FOLLOW UP Is Patient Diabetic? No Pain Assessment Patient in pain? yes      Intensity: 8  Does patient need assistance? Functional Status Self care Ambulation Normal   CC:  BACK PAIN FOLLOW UP.  History of Present Illness: Here with neighbor interpreting today  1.  Back Pain:  Has obtained Medicaid--ready to be seen by Neuro at this point.  Pt. would like to continue with referral to [facility].  Pt. has been taking Tramadol from a friend--in a bottle with sticker removed-taking 50 mg two times a day for past week and has been taking for about 2 weeks.    2.  Depression:  Taking 100 mg of Zoloft--sounds like sometimes she misses.  Is sleeping better with higher dose.  Not crying as much.     3.  Hypertension:  Misses Lisinopril intermittently--has not filled since 04/07/10   Current Medications (verified): 1)  Lisinopril 10 Mg  Tabs (Lisinopril) .... Take One Tablet Once Daily 2)  Zoloft 50 Mg Tabs (Sertraline Hcl) .... 2 Tabs By Mouth Daily 3)  Cyclobenzaprine Hcl 10 Mg Tabs (Cyclobenzaprine Hcl) .Marland Kitchen.. 1 By Mouth Q 8hour As Needed 4)  Diclofenac Sodium 75 Mg Tbec (Diclofenac Sodium) .Marland Kitchen.. 1 Tab By Mouth Two Times A Day With Meals.  [facility] Rx  Allergies (verified): No Known Drug Allergies  Physical Exam  General:  Depressed affect.  Does not appears acutely in pain as she has in many of her previous exams Lungs:  Normal respiratory effort, chest expands symmetrically. Lungs are clear to auscultation, no crackles or wheezes. Heart:  Normal rate and regular rhythm. S1 and S2 normal without gallop, murmur, click, rub or other  extra sounds.  Radial pulses normal and equal Msk:  Tender over left lumbar   Impression & Recommendations:  Problem # 1:  LEFT LOW BACK PAIN, CHRONIC (ICD-724.2) Will refer to Neurology at [facility] I have not been able to find a particular problem to explain her extreme pain Feel she has at least some emotional overlay with depression and has not continued with counseling. See below regarding pain medication as well Encouraged her never to use someone else's med Pain contract to be signed today. The following medications were removed from the medication list:    Diclofenac Sodium 75 Mg Tbec (Diclofenac sodium) .Marland Kitchen... 1 tab by mouth two times a day with meals.  [facility] rx Her updated medication list for this problem includes:    Cyclobenzaprine Hcl 10 Mg Tabs (Cyclobenzaprine hcl) .Marland Kitchen... 1 by mouth q 8hour as needed    Hydrocodone-acetaminophen 5-500 Mg Tabs (Hydrocodone-acetaminophen) .Marland Kitchen... 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 finally agrees to continue with Zoloft and discontinue use of Tramadol--discussed Serotonin syndrome and increased risk of seizure with the combination  Problem # 3:  HYPERTENSION (ICD-401.9)  Not adequately controlled--to be consistent with  bp med. Her updated medication list for this problem includes:    Lisinopril 10 Mg Tabs (Lisinopril) .Marland Kitchen... Take one tablet once daily  Orders: T-Comprehensive Metabolic Panel ([id number])  Problem # 4:  Preventive Health Care (ICD-V70.0) Refuses flu vaccine--scared of it despite counseling regarding recommendation for the vaccine.  Complete Medication List: 1)  Lisinopril 10 Mg Tabs (Lisinopril) .... Take one tablet once daily 2)  Zoloft 50 Mg Tabs (Sertraline hcl) .... 2 tabs by mouth daily 3)  Cyclobenzaprine Hcl 10 Mg Tabs (Cyclobenzaprine hcl) .Marland Kitchen.. 1 by mouth q 8hour as needed 4)   Hydrocodone-acetaminophen 5-500 Mg Tabs (Hydrocodone-acetaminophen) .Marland Kitchen.. 1 tab by mouth up to two times a day as needed for pain  Other Orders: UA Dipstick w/o Micro (manual) (29528)  Patient Instructions: 1)  appt. with Amanda--for counseling and to get set up with another counselor for depression 2)  Follow up with Dr. Delrae Alfred in 6 months --bp 3)  Nurse visit for bp check in 2 months Prescriptions: ZOLOFT 50 MG TABS (SERTRALINE HCL) 2 tabs by mouth daily  #60 x 11   Entered and Authorized by:   Julieanne Manson MD   Signed by:   Julieanne Manson MD on 05/13/2010   Method used:   Electronically to        [facility] [phone]* (retail)       [address]       [place], [place]  [address]       Ph: [phone]       Fax: [phone]   RxID:   [id number] LISINOPRIL 10 MG  TABS (LISINOPRIL) take one tablet once daily  #30 x 11   Entered and Authorized by:   Julieanne Manson MD   Signed by:   Julieanne Manson MD on 05/13/2010   Method used:   Electronically to        [facility] [phone]* (retail)       [address]       [place], [place]  [address]       Ph: [phone]       Fax: [phone]   RxID:   [id number] HYDROCODONE-ACETAMINOPHEN 5-500 MG TABS (HYDROCODONE-ACETAMINOPHEN) 1 tab by mouth up to two times a day as needed for pain  #60 x 0   Entered and Authorized by:   Julieanne Manson MD   Signed by:   Julieanne Manson MD on 05/13/2010   Method used:   Print then Give to Patient   RxID:   [id number] ZOLOFT 50 MG TABS (SERTRALINE HCL) 2 tabs by mouth daily  #60 x 11   Entered and Authorized by:   Julieanne Manson MD   Signed by:   Julieanne Manson MD on 05/13/2010   Method used:   Faxed to ...       [facility] (retail)       [address] [place], [place]  [address]       Ph: [phone]       Fax: [phone]   RxID:   [id number] LISINOPRIL 10 MG  TABS (LISINOPRIL) take  one tablet once daily  #30 x 11   Entered and Authorized by:   Julieanne Manson MD   Signed by:   Julieanne Manson MD on 05/13/2010   Method used:   Faxed to ...       [facility] (retail)       [phone]  [address] [place], [place]  [address]       Ph: [phone]       Fax: [phone]   RxID:   [id number]   Laboratory Results   Urine Tests  Date/Time Received: May 13, 2010 Date/Time Reported: 12:23 PM  Routine Urinalysis   Color: lt. yellow Appearance: Clear Glucose: negative   (Normal Range: Negative) Bilirubin: negative   (Normal Range: Negative) Ketone: negative   (Normal Range: Negative) Spec. Gravity: 1.025   (Normal Range: 1.003-1.035) Blood: negative   (Normal Range: Negative) pH: 5.5   (Normal Range: 5.0-8.0) Protein: negative   (Normal Range: Negative) Urobilinogen: 0.2   (Normal Range: 0-1) Nitrite: negative   (Normal Range: Negative) Leukocyte Esterace: trace   (Normal Range: Negative)

## 2010-08-30 NOTE — Progress Notes (Signed)
Summary: Neurology referral  Phone Note Outgoing Call   Summary of Call: Melissa Jones--ready now for Neuro consult--new phone number in her chart along with son's.  Also, a friend who sometimes comes in with her to translate--Melissa Jones --listed in her registration area. Letter and new order written She has had multiple radiologic evals--can find in [place] at [facility].  If unable to access, let me know and will make copies and have them faxed over.  Thanks Initial call taken by: Julieanne Manson MD,  January 13, 2010 5:49 PM  Follow-up for Phone Call        I thought  radiology referrals were done by the staff there  Pt will need 200.00 locally & [facility] Pt can go to [facility] for 100.00. Is Pt aware of these options? Follow-up by: Candi Leash,  January 14, 2010 9:37 AM  Additional Follow-up for Phone Call Additional follow up Details #1::        The staff here does do the radiologic referrals--I'm talking about the MRI/Xrays, etc. she has already had to evaluate her back and pelvic area that should accompany my letter.  Most of them were done before we went to EMR, so they can be found only on echart at the hospital or in her paper chart.   If you do not have access to echart, I can copy off these results and fax them to you.   They are not aware of the specific charges you listed above, but I told them there would be an up front charge. Additional Follow-up by: Julieanne Manson MD,  January 17, 2010 6:24 PM    Additional Follow-up for Phone Call Additional follow up Details #2::    I am having a really hard time contacting this Pt about this referral. Follow-up by: Candi Leash,  January 26, 2010 9:02 AM  Additional Follow-up for Phone Call Additional follow up Details #3:: Details for Additional Follow-up Action Taken: Tiffany--can you see if we have any other ways to contact pt. as Melissa Jones cannot get hold of them--I have several numbers listed in her registration info.  Thanks  Julieanne Manson MD  January 27, 2010 5:14 PM   I talk to Melissa Jones friend she said that she is in [place] she will pass the message about $200 at front and payment plan . Pt call back and Melissa Jones said that she apply for [facility] july 5 and we are going to wait for her approval to go with the referall . Additional Follow-up by: Cheryll Dessert,  February 15, 2010 3:26 PM

## 2010-08-30 NOTE — Letter (Signed)
Summary: MAILED REQUESTED RECORDS TO [facility]  MAILED REQUESTED RECORDS TO [facility]   Imported By: Arta Bruce 01/14/2010 11:08:44  _____________________________________________________________________  External Attachment:    Type:   Image     Comment:   External Document

## 2010-08-30 NOTE — Letter (Signed)
Summary: REFERRAL/RADIOLOGY  REFERRAL/RADIOLOGY   Imported By: Arta Bruce 12/07/2009 11:16:53  _____________________________________________________________________  External Attachment:    Type:   Image     Comment:   External Document

## 2010-08-30 NOTE — Progress Notes (Signed)
Summary: Neuro update  Phone Note From Other Clinic   Caller: Referral Coordinator Summary of Call: Have called and lt several messages for Pt regarding the Neuro referral.Pt has not returned any calls.Pt would need 200.00 up front locally or @ [facility]. Initial call taken by: Candi Leash,  Dec 13, 2009 10:04 AM  Follow-up for Phone Call        Debra--after last visit, decided to hold on Neuro referral until we see if she responds to a series of injections.  Sorry. Follow-up by: Julieanne Manson MD,  Dec 15, 2009 8:29 AM

## 2010-08-30 NOTE — Letter (Signed)
Summary: *Referral Letter  [facility]  [address] [place], [place] [address]   Phone: [phone]  Fax: [phone]    01/13/2010  Thank you in advance for agreeing to see my patient:  Melissa Jones [address] [place], [place]  [address]  Phone: [phone]  Reason for Referral: What has become chronic left low back pain that appears to be quite intense for abnormalities found on evaluation.  The pain sometimes radiates to groin area, sometimes down to buttock and more focused on ischial tuberosity as an area of point tenderness or simply down back of thigh.  Other than what she describes as severe pain and sometimes tenderness, I am not finding any concerning red flags.  She has had Xrays of hip, pelvis, and spine;  ultrasound of pelvis and I believe CT as well, MRI of L/S spine.  She has mild bilateral foraminal stenoisis at l4-5 and L5-S1 on MRI.  She has received PT for the pain and they have actually called [place] emergently and sent her to the ED because of the severe pain she appears to be in at times.  She most recently received a series of 2 epidural corticosteroid injections with interventional radiology to see if this helped.  She seemed to have a bit of benefit with the initial injection, but states the pain worsened with the second a month later and has refused to go back.  I am unable to find anything to lessen her discomfort--we have also tried NSAIDS and narcotic pain relievers without improvement.   Would like to have a Neurology evaluation secondary to apparent extreme pain with currently minimal findings.  I do believe Melissa Jones is also depressed, which may ultimately turn out to be a large factor in the severity of her pain, and we are addressing that as well. 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) 7)  PRIMARY HYPERPARATHYROIDISM (ICD-252.01) 8)  BACK PAIN, LUMBAR (ICD-724.2) 9)  HYPERCALCEMIA (ICD-275.42) 10)  HEADACHE, REBOUND (ICD-784.0) 11)  PAP SMEAR, ABNORMAL, ASCUS (ICD-795.01) 12)  SCREENING FOR MALIGNANT NEOPLASM, CERVIX (ICD-V76.2) 13)  CHEST PAIN, ATYPICAL (ICD-786.59) 14)  HYPERTENSION (ICD-401.9) 15)  HYPERLIPIDEMIA (ICD-272.4) 16)  ESSENTIAL HYPERTENSION, BENIGN (ICD-401.1) 17)  EXAMINATION, ROUTINE MEDICAL (ICD-V70.0)   Current Medications: 1)  LISINOPRIL 10 MG  TABS (LISINOPRIL) take one tablet once daily 2)  ZOLOFT 50 MG TABS (SERTRALINE HCL) 2 tabs by mouth daily 3)  CYCLOBENZAPRINE HCL 10 MG TABS (CYCLOBENZAPRINE HCL) 1 by mouth q 8hour as needed 4)  DICLOFENAC SODIUM 75 MG TBEC (DICLOFENAC SODIUM) 1 tab by mouth two times a day with meals.  [facility] Rx   Past Medical History: 1)  Hypertension 2)  Atypical Chest Pain;evaluated by cards, Dr. Eden Emms 12/08. 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  Sincerely,  Julieanne Manson MD

## 2010-08-30 NOTE — Letter (Signed)
Summary: Melissa Jones'S SUMMARY  Melissa Jones'S SUMMARY   Imported By: Arta Bruce 02/10/2010 12:08:42  _____________________________________________________________________  External Attachment:    Type:   Image     Comment:   External Document

## 2010-08-30 NOTE — Letter (Signed)
Summary: MAILED RECORDS TO/[facility]  MAILED RECORDS TO/[facility]   Imported By: Arta Bruce 04/18/2010 12:18:24  _____________________________________________________________________  External Attachment:    Type:   Image     Comment:   External Document

## 2010-08-30 NOTE — Assessment & Plan Note (Signed)
Summary: back and leg/ headaches/ restlessness/ facial flushing/ leg c...   Vital Signs:  Patient profile:   55 year old female Weight:      139.6 pounds Temp:     97.9 degrees F Pulse rate:   65 / minute Pulse rhythm:   regular Resp:     20 per minute BP sitting:   134 / 65  (left arm) Cuff size:   regular  Vitals Entered By: Vesta Mixer CMA (Dec 07, 2009 9:07 AM) CC: Had a shot in her back, but is still in pain.  Needs refill of flexeril. Is Patient Diabetic? No Pain Assessment Patient in pain? yes     Location: back/leg Intensity: 9  Does patient need assistance? Ambulation Normal   CC:  Had a shot in her back and but is still in pain.  Needs refill of flexeril.Marland Kitchen  History of Present Illness: 1.  Low back pain:  Interventional Radiology--L4-5 corticosteroid epidural injection.  Had improvement in pain--actual complete resolution--for about 24 hours.   Pt. was told would probably need 2-3 more injections.  Apparently needs rereferral from this office to receive again.   Pt. states that she is applying again for disability--was denied initially.    2.  Very down regarding condition.  Separated from husband she states because of her painful condition--she states she is not able to have relations because of the pain.  Lots of things going on in her head  with these major life changes.  Pt. denies current suicidal ideation.  She did attempt suicide by taking an overdose of pills years ago.  She states an attempt was made to have her involuntarily hospitalized--husband attempted-but she refused.  Pt. was placed on medication--helped, but sounds like had a lot of problems with marriage that continued to cause her problems--husband also with schizophrenia.  Willing to see Newman Nip not been to her in past  Current Medications (verified): 1)  Lisinopril 10 Mg  Tabs (Lisinopril) .... Take One Tablet Once Daily 2)  Zoloft 50 Mg Tabs (Sertraline Hcl) .... 1/2 Tab By Mouth Daily  For 7 Days, Then 1 Tab By Mouth Daily Thereafter. 3)  Cyclobenzaprine Hcl 10 Mg Tabs (Cyclobenzaprine Hcl) .Marland Kitchen.. 1 By Mouth Q 8hour As Needed 4)  Diclofenac Sodium 75 Mg Tbec (Diclofenac Sodium) .Marland Kitchen.. 1 Tab By Mouth Two Times A Day With Meals.  [facility] Rx  Allergies (verified): No Known Drug Allergies  Physical Exam  General:  Tearful at times Lungs:  Normal respiratory effort, chest expands symmetrically. Lungs are clear to auscultation, no crackles or wheezes. Heart:  Normal rate and regular rhythm. S1 and S2 normal without gallop, murmur, click, rub or other extra sounds.  Radial pulses normal and equal. Msk:  Mild tenderness in left lumbosacral paraspinous musculature Neurologic:  strength normal in all extremities and DTRs symmetrical and normal.     Impression & Recommendations:  Problem # 1:  LEFT LOW BACK PAIN, CHRONIC (ICD-724.2)  Not sure how much emotional overlay adding to this--getting more information every visit that this may be more of an element than previously thought--psych issues appear to predated pain as well. Will Check with interventional radiology about getting her back for a series of injections. Hold on Neurology referral until see how she responds to injections--does not appear to have gone through at this point anyway Her updated medication list for this problem includes:    Cyclobenzaprine Hcl 10 Mg Tabs (Cyclobenzaprine hcl) .Marland Kitchen... 1 by mouth q 8hour as  needed    Diclofenac Sodium 75 Mg Tbec (Diclofenac sodium) .Marland Kitchen... 1 tab by mouth two times a day with meals.  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 Take one tablet once daily 2)  Zoloft 50 Mg Tabs (Sertraline hcl) .... 2 tabs by mouth daily 3)  Cyclobenzaprine Hcl 10 Mg Tabs  (Cyclobenzaprine hcl) .Marland Kitchen.. 1 by mouth q 8hour as needed 4)  Diclofenac Sodium 75 Mg Tbec (Diclofenac sodium) .Marland Kitchen.. 1 tab by mouth two times a day with meals.  [facility] rx  Patient Instructions: 1)  Follow up with Dr. Delrae Alfred in 6 weeks--depression 2)  Referral to Aquilla Solian Prescriptions: ZOLOFT 50 MG TABS (SERTRALINE HCL) 2 tabs by mouth daily  #60 x 6   Entered and Authorized by:   Julieanne Manson MD   Signed by:   Julieanne Manson MD on 12/07/2009   Method used:   Faxed to ...       [facility] (retail)       [address] [place], [place]  [address]       Ph: [phone]       Fax: [phone]   RxID:   [id number] CYCLOBENZAPRINE HCL 10 MG TABS (CYCLOBENZAPRINE HCL) 1 by mouth q 8hour as needed  #30 x 1   Entered and Authorized by:   Julieanne Manson MD   Signed by:   Julieanne Manson MD on 12/07/2009   Method used:   Faxed to ...       [facility] - [facility] (retail)       [address] [place], [place]  [address]       Ph: [phone]       Fax: [phone]   RxID:   [id number]   Appended Document: back and leg/ headaches/ restlessness/ facial flushing/ leg c... Called Interventional Radiology:  They do inject Sensorcaine with corticosteroid, which is likely the cause of initial improvement.  Given # of outpatient office to schedule another injection--usually every 2-3 months with max of 3 injections in 1 year.  Left message with Darel Hong at [facility] regarding Ms. Hagenow--would like to know what criteria pt. needs to meet for repeat injections.  Will fax referral to [phone].  Phone is [phone]  Appended Document: back and leg/ headaches/ restlessness/ facial flushing/ leg c... Tiffany--did the radiology referral go through--did she get set back up with interventional radiology?  Appended Document: back and leg/ headaches/ restlessness/ facial flushing/ leg  c... Referral did go through and they are going to contact pt with appt.

## 2010-08-30 NOTE — Letter (Signed)
Summary: *Referral Letter  [facility]  [address] [place], [place] [address]   Phone: [phone]  Fax: [phone]    12/20/2009    Re:  Melissa Jones        [address] [place], [place]  [address]         Phone: [phone]  To Whom It May Concern:    Ms. Haseman is a patient at our facility who is undergoing treatment with epidural corticosteroid injections currently for chronic low back pain.  Her other medical concerns and medications are listed below.  Please let me know if further information is needed.   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) 7)  PRIMARY HYPERPARATHYROIDISM (ICD-252.01) 8)  BACK PAIN, LUMBAR (ICD-724.2) 9)  HYPERCALCEMIA (ICD-275.42) 10)  HEADACHE, REBOUND (ICD-784.0) 11)  PAP SMEAR, ABNORMAL, ASCUS (ICD-795.01) 12)  SCREENING FOR MALIGNANT NEOPLASM, CERVIX (ICD-V76.2) 13)  CHEST PAIN, ATYPICAL (ICD-786.59) 14)  HYPERTENSION (ICD-401.9) 15)  HYPERLIPIDEMIA (ICD-272.4) 16)  ESSENTIAL HYPERTENSION, BENIGN (ICD-401.1) 17)  EXAMINATION, ROUTINE MEDICAL (ICD-V70.0)   Current Medications: 1)  LISINOPRIL 10 MG  TABS (LISINOPRIL) take one tablet once daily 2)  ZOLOFT 50 MG TABS (SERTRALINE HCL) 2 tabs by mouth daily 3)  CYCLOBENZAPRINE HCL 10 MG TABS (CYCLOBENZAPRINE HCL) 1 by mouth q 8hour as needed 4)  DICLOFENAC SODIUM 75 MG TBEC (DICLOFENAC SODIUM) 1 tab by mouth two times a day with meals.  [facility] Rx   Past Medical History: 1)  Hypertension 2)  Atypical Chest Pain;evaluated by cards, Dr. Eden Emms 12/08. 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

## 2010-08-30 NOTE — Progress Notes (Signed)
Summary: Refill cyclobenzaprine  Phone Note Refill Request   Refills Requested: Medication #1:  CYCLOBENZAPRINE HCL 10 MG TABS 1 by mouth q 8hour as needed [facility] [address]  Initial call taken by: Arm[place] Shannon,  July 07, 2010 4:14 PM  Follow-up for Phone Call        Notify pt. sent to [facility] pharmacy Follow-up by: Julieanne Manson MD,  July 08, 2010 8:38 AM  Additional Follow-up for Phone Call Additional follow up Details #1::        called med into [facility] pharmacy.. pt is aware. Armenia Shannon  July 08, 2010 3:32 PM     Prescriptions: CYCLOBENZAPRINE HCL 10 MG TABS (CYCLOBENZAPRINE HCL) 1 by mouth q 8hour as needed  #30 x 1   Entered and Authorized by:   Julieanne Manson MD   Signed by:   Julieanne Manson MD on 07/08/2010   Method used:   Printed then faxed to ...       [facility] - [facility] (retail)       [address] [place], [place]  [address]       Ph: [phone]       Fax: [phone]   RxID:   [id number]

## 2010-08-30 NOTE — Letter (Signed)
Summary: *Referral Letter  [facility]  [address] [place], [place] [address]   Phone: [phone]  Fax: [phone]    10/24/2009  Thank you in advance for agreeing to see my patient:  Melissa Jones [address] [place], [place]  [address]  Phone: [phone]  Reason for Referral: Pt. with 3 + years of low back pain, at times appears fairly debilitating .  At times with history of left leg radicular symptoms, though that is not a consistent complaint.  She went for short term PT, but was stopped as she appeared to be in such pain with some of the exercises/manipulations.  Has not really responded to muscle relaxants/pain medication.  MRI was obtained 02/22/09 with findings of mild bilateral foraminal stenoses at L4-5 and L5-S1.  Mild right subarticular lateral recess stenosis at L4-5.  Her evaluation has been interrupted by trips [place] to [place] ill relatives and for surgical treatment of hyperparathyoidism.  I have been unable to get evaluation from a neurologic or orthopedic specialist in the more local area secondary to finances.  I am attempting to see if [facility] Interventional Radiology may be able to help with corticosteroid injections.  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) 7)  PRIMARY HYPERPARATHYROIDISM (ICD-252.01) 8)  BACK PAIN, LUMBAR (ICD-724.2) 9)  HYPERCALCEMIA (ICD-275.42) 10)  HEADACHE, REBOUND (ICD-784.0) 11)  PAP SMEAR, ABNORMAL, ASCUS (ICD-795.01) 12)  SCREENING FOR MALIGNANT NEOPLASM, CERVIX (ICD-V76.2) 13)  CHEST PAIN, ATYPICAL (ICD-786.59) 14)  HYPERTENSION (ICD-401.9) 15)  HYPERLIPIDEMIA  (ICD-272.4) 16)  ESSENTIAL HYPERTENSION, BENIGN (ICD-401.1) 17)  EXAMINATION, ROUTINE MEDICAL (ICD-V70.0)   Current Medications: 1)  LISINOPRIL 10 MG  TABS (LISINOPRIL) take one tablet once daily 2)  ZOLOFT 50 MG TABS (SERTRALINE HCL) 1/2 tab by mouth daily for 7 days, then 1 tab by mouth daily thereafter. 3)  CYCLOBENZAPRINE HCL 10 MG TABS (CYCLOBENZAPRINE HCL) 1 by mouth q 8hour as needed 4)  DICLOFENAC SODIUM 75 MG TBEC (DICLOFENAC SODIUM) 1 tab by mouth two times a day with meals.  [facility] Rx   Past Medical History: 1)  Hypertension 2)  Atypical Chest Pain;evaluated by cards, Dr. Eden Emms 12/08. 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  Sincerely,  Julieanne Manson MD

## 2010-08-30 NOTE — Assessment & Plan Note (Signed)
Summary: FOLLOW UP WITH DR Dariel Pellecchia IN 3 MONTHS FOR BACK PAIN//CARPAL ...   Vital Signs:  Patient profile:   55 year old female Height:      61.50 inches Weight:      143 pounds BMI:     26.68 Temp:     97.7 degrees F oral Pulse rate:   78 / minute Pulse rhythm:   regular Resp:     18 per minute BP sitting:   137 / 93  (left arm) Cuff size:   regular  Vitals Entered By: Armenia Shannon (January 13, 2010 4:23 PM) CC: pt says her med is making her stomach hurt when she takes cyclobenzaprine... Is Patient Diabetic? No Pain Assessment Patient in pain? no       Does patient need assistance? Functional Status Self care Ambulation Normal   CC:  pt says her med is making her stomach hurt when she takes cyclobenzaprine....  History of Present Illness: 1.  Left lumbar radiculopathy:  Pt. had a second epidural injection-May 19th.  Pt. states she had a reaction with shaking and nausea and pain actually was worse.  Pt. went into ED for the same pain last night.  Had extensive  evaluation again --including CBC, UA, CMET, pelvic ultrasound  that was unremarkable and felt secondary to her chronic back concerns.  Long discussion today (pt. accompanied by a young woman who is her friend and "like a daughter").  Jamesetta Orleans [phone])  Feel a large element is her depression.  Epigastric burning with Cyclobenzaprine for first time last night, but took on empty stomach.  2.  Depression:  Missed appt with Newman Nip been rescheduled for 6/23.    Is not taking Zoloft  100 mg daily--often only taking 50 mg daily.  The Zoloft makes her sleepy and having problems getting up in morning, so would cut back some days.  Discussed not allowing her body to adjust to the med.  Current Medications (verified): 1)  Lisinopril 10 Mg  Tabs (Lisinopril) .... Take One Tablet Once Daily 2)  Zoloft 50 Mg Tabs (Sertraline Hcl) .... 2 Tabs By Mouth Daily 3)  Cyclobenzaprine Hcl 10 Mg Tabs (Cyclobenzaprine Hcl)  .Marland Kitchen.. 1 By Mouth Q 8hour As Needed 4)  Diclofenac Sodium 75 Mg Tbec (Diclofenac Sodium) .Marland Kitchen.. 1 Tab By Mouth Two Times A Day With Meals.  [facility] Rx  Allergies (verified): No Known Drug Allergies  Physical Exam  General:  NAD while discussing pain, but when start to examine, seems to become more magnified with how she responds to movement. Abdomen:  soft, non-tender, normal bowel sounds, no distention, no masses, no hepatomegaly, and no splenomegaly.   Msk:  Minimal tenderness over left sacral paraspinous musculature and along inguinal ligament   Impression & Recommendations:  Problem # 1:  LEFT LOW BACK PAIN, CHRONIC (ICD-724.2)  Pt. does not feel epidural injections have been helpful. Long discussion that she needs to bring her chronic issues to our clinic and not frequent the ED--has been seen there twice for different pains since last seen. Has Hydrocodone from ED for pain. Referral to Neuro To keep appt. with Aquilla Solian Her updated medication list for this problem includes:    Cyclobenzaprine Hcl 10 Mg Tabs (Cyclobenzaprine hcl) .Marland Kitchen... 1 by mouth q 8hour as needed    Diclofenac Sodium 75 Mg Tbec (Diclofenac sodium) .Marland Kitchen... 1 tab by mouth two times a day with meals.  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 to take Zoloft regularly at 100 mg daily and follow up with Aquilla Solian  Complete Medication List: 1)  Lisinopril 10 Mg Tabs (Lisinopril) .... Take one tablet once daily 2)  Zoloft 50 Mg Tabs (Sertraline hcl) .... 2 tabs by mouth daily 3)  Cyclobenzaprine Hcl 10 Mg Tabs (Cyclobenzaprine hcl) .Marland Kitchen.. 1 by mouth q 8hour as needed 4)  Diclofenac Sodium 75 Mg Tbec (Diclofenac sodium) .Marland Kitchen.. 1 tab by mouth two times a day with meals.  [facility] rx  Patient Instructions: 1)  Follow up with Dr. Delrae Alfred in 3 months--depression and back pain 2)  Keep appt. with Aquilla Solian

## 2010-08-30 NOTE — Letter (Signed)
Summary: PT REQUESTING RECORDS FOR SELF /TO PICK UP  PT REQUESTING RECORDS FOR SELF /TO PICK UP   Imported By: Arta Bruce 06/03/2010 12:13:51  _____________________________________________________________________  External Attachment:    Type:   Image     Comment:   External Document

## 2010-08-30 NOTE — Assessment & Plan Note (Signed)
Summary: follow up with Dr Delrae Alfred in 4 months//gk   Vital Signs:  Patient profile:   55 year old female Weight:      141.5 pounds BMI:     26.40 Temp:     97.9 degrees F Pulse rate:   91 / minute Pulse rhythm:   regular Resp:     20 per minute BP sitting:   133 / 88  (left arm) Cuff size:   regular  Vitals Entered By: Vesta Mixer CMA (October 08, 2009 9:33 AM) CC: 4 month f/u still having bad back pain Is Patient Diabetic? No Pain Assessment Patient in pain? yes     Location: back Intensity: 8  Does patient need assistance? Ambulation Normal   CC:  4 month f/u still having bad back pain.  History of Present Illness: 1.  Low Back Pain:  Initially, states pain meds are helping, but then states getting epigastric discomfort and nausea vomiting as well as diarrhea at times related to Arthrotec use.  Daughter from [place] here today Melissa Jones:  [phone].  Long discussion regarding options.  Mainly having left low back pain.  Feels shocks in legs--describes it as coming up from feet, not radiating down legs.    2.  Bilateral hand numbness:  1 1/2 months.  Wakes her up at night.  No repetitive motions with hands.  Usual housecleaning chores.  Allergies (verified): No Known Drug Allergies  Physical Exam  Msk:  Tender in left lumbar paraspinous musculature.  NT over ischial tuberosity. Neurologic:  strength normal in all extremities and DTRs symmetrical and normal.   Positive Tinel's and PHalen's over median nerve bilaterally.   Impression & Recommendations:  Problem # 1:  CARPAL TUNNEL SYNDROME, BILATERAL (ICD-354.0) Bilateral cock up splints given--to wear nightly Arthrotec discontinued--tolerated plain Diclofenac previouly and can get that at [facility] pharmacy  Problem # 2:  LEFT LOW BACK PAIN, CHRONIC (ICD-724.2)  Will try to send to [facility] neurology, but discussed with daughter that exam and complaints are not always constistent and MRI did not show a significant  abnormality to explain the severity of her symptoms. Switch to Diclofenac as above. The following medications were removed from the medication list:    Arthrotec 75 75-200 Mg-mcg Tabs (Diclofenac-misoprostol) .Marland Kitchen... 1 tab by mouth two times a day with food Her updated medication list for this problem includes:    Cyclobenzaprine Hcl 10 Mg Tabs (Cyclobenzaprine hcl) .Marland Kitchen... 1 by mouth q 8hour as needed    Diclofenac Sodium 75 Mg Tbec (Diclofenac sodium) .Marland Kitchen... 1 tab by mouth two times a day with meals.  [facility] rx  Orders: Radiology Referral (Radiology) Neurology Referral (Neuro)  Complete Medication List: 1)  Lisinopril 10 Mg Tabs (Lisinopril) .... Take one tablet once daily 2)  Zoloft 50 Mg Tabs (Sertraline hcl) .... 1/2 tab by mouth daily for 7 days, then 1 tab by mouth daily thereafter. 3)  Cyclobenzaprine Hcl 10 Mg Tabs (Cyclobenzaprine hcl) .Marland Kitchen.. 1 by mouth q 8hour as needed 4)  Diclofenac Sodium 75 Mg Tbec (Diclofenac sodium) .Marland Kitchen.. 1 tab by mouth two times a day with meals.  [facility] rx  Patient Instructions: 1)  Referral to Neurology at [facility] 2)  Referral to Interventional Radiology to see if injection in back is helpful. 3)  Follow up with Dr. Delrae Alfred in 3 months for back pain, Carpal Tunnel Syndrome Prescriptions: DICLOFENAC SODIUM 75 MG TBEC (DICLOFENAC SODIUM) 1 tab by mouth two times a day with meals.  [facility] Rx  #  60 x 6   Entered and Authorized by:   Julieanne Manson MD   Signed by:   Julieanne Manson MD on 10/08/2009   Method used:   Print then Give to Patient   RxID:   [id number]

## 2010-08-30 NOTE — Letter (Signed)
Summary: REQUEST DISABILITY EVAL//DENIED  REQUEST DISABILITY EVAL//DENIED   Imported By: Arta Bruce 01/18/2010 10:28:35  _____________________________________________________________________  External Attachment:    Type:   Image     Comment:   External Document

## 2010-09-01 NOTE — Letter (Signed)
Summary: [facility]  [facility]   Imported By: Arta Bruce 07/28/2010 16:27:40  _____________________________________________________________________  External Attachment:    Type:   Image     Comment:   External Document

## 2010-09-01 NOTE — Letter (Signed)
Summary: MAILED REQUESTED ERCORDS TO [facility]  MAILED REQUESTED ERCORDS TO [facility]   Imported By: Arta Bruce 07/11/2010 09:40:12  _____________________________________________________________________  External Attachment:    Type:   Image     Comment:   External Document

## 2010-09-01 NOTE — Letter (Signed)
Summary: MAILED REQUESTED RECORDS TO [facility]  MAILED REQUESTED RECORDS TO [facility]   Imported By: Arta Bruce 07/11/2010 09:33:31  _____________________________________________________________________  External Attachment:    Type:   Image     Comment:   External Document

## 2010-09-01 NOTE — Assessment & Plan Note (Signed)
Summary: 2 MONTH FU FOR BP CHECK//KT  Nurse Visit   Vital Signs:  Patient profile:   55 year old female Temp:     97.0 degrees F oral Pulse rate:   88 / minute Pulse rhythm:   regular Resp:     16 per minute BP sitting:   106 / 70  (right arm) Cuff size:   regular  Vitals Entered By: Dutch Quint RN (July 14, 2010 2:58 PM)  CC:  BP recheck.  History of Present Illness: 05/13/10 BP was 132/90.  Is on lisinopril - takes it at 4 pm in the afternoon.  Last took medication yesterday afternoon.  Appears very lethargic.   Patient Instructions: 1)  Reviewed with Dr. Delrae Alfred 2)  Your blood pressure is much better-keep doing what you're doing. 3)  Continue taking your medications as ordered -- take every day. 4)  Make appointment to see your provider for 4 months 5)  Call if anything changes.   Impression & Recommendations:  Problem # 1:  HYPERTENSION (ICD-401.9) BP is much improved Continue medications F/U with provider in 4 months  Her updated medication list for this problem includes:    Lisinopril 10 Mg Tabs (Lisinopril) .Marland Kitchen... Take one tablet once daily  Complete Medication List: 1)  Lisinopril 10 Mg Tabs (Lisinopril) .... Take one tablet once daily 2)  Zoloft 50 Mg Tabs (Sertraline hcl) .... 2 tabs by mouth daily 3)  Cyclobenzaprine Hcl 10 Mg Tabs (Cyclobenzaprine hcl) .Marland Kitchen.. 1 by mouth q 8hour as needed 4)  Hydrocodone-acetaminophen 5-500 Mg Tabs (Hydrocodone-acetaminophen) .Marland Kitchen.. 1 tab by mouth up to two times a day as needed for pain   Review of Systems CV:  States when she takes the BP med, it gives her a headache, not severe.  Has a little bit of dizziness, some nausea.  Denies cough, peripheral edema, visual changes.  Denies palpitations, but states it feels like her heart "jumps" -- usually happens at night when she's lying down, makes her catch her breath.  Denies anxiety before it occurs.  Has pain at left base of neck since surgery.  Did not tell the doctor  about this at her last visit.Marland Kitchen   Physical Exam  General:  alert, well-developed, well-nourished, and well-hydrated.    CC: BP recheck Is Patient Diabetic? No  Does patient need assistance? Functional Status Self care Ambulation Normal   Allergies: No Known Drug Allergies  Orders Added: 1)  Est. Patient Level I [16109]

## 2010-09-09 ENCOUNTER — Encounter (INDEPENDENT_AMBULATORY_CARE_PROVIDER_SITE_OTHER): Payer: Self-pay | Admitting: Internal Medicine

## 2010-09-10 ENCOUNTER — Emergency Department (HOSPITAL_COMMUNITY)
Admission: EM | Admit: 2010-09-10 | Discharge: 2010-09-10 | Disposition: A | Discharge status: Home / Self Care | Payer: Medicaid Other | Attending: Emergency Medicine | Admitting: Emergency Medicine

## 2010-09-10 ENCOUNTER — Emergency Department (HOSPITAL_COMMUNITY): Payer: Medicaid Other

## 2010-09-10 ENCOUNTER — Encounter (HOSPITAL_COMMUNITY): Payer: Self-pay | Admitting: Radiology

## 2010-09-10 DIAGNOSIS — I10 Essential (primary) hypertension: Secondary | ICD-10-CM | POA: Insufficient documentation

## 2010-09-10 DIAGNOSIS — R220 Localized swelling, mass and lump, head: Secondary | ICD-10-CM | POA: Insufficient documentation

## 2010-09-10 DIAGNOSIS — R599 Enlarged lymph nodes, unspecified: Secondary | ICD-10-CM | POA: Insufficient documentation

## 2010-09-10 DIAGNOSIS — M549 Dorsalgia, unspecified: Secondary | ICD-10-CM | POA: Insufficient documentation

## 2010-09-10 DIAGNOSIS — R221 Localized swelling, mass and lump, neck: Secondary | ICD-10-CM | POA: Insufficient documentation

## 2010-09-10 DIAGNOSIS — M542 Cervicalgia: Secondary | ICD-10-CM | POA: Insufficient documentation

## 2010-09-10 DIAGNOSIS — Z79899 Other long term (current) drug therapy: Secondary | ICD-10-CM | POA: Insufficient documentation

## 2010-09-10 DIAGNOSIS — R634 Abnormal weight loss: Secondary | ICD-10-CM | POA: Insufficient documentation

## 2010-09-10 DIAGNOSIS — J029 Acute pharyngitis, unspecified: Secondary | ICD-10-CM | POA: Insufficient documentation

## 2010-09-10 DIAGNOSIS — G8929 Other chronic pain: Secondary | ICD-10-CM | POA: Insufficient documentation

## 2010-09-10 MED ORDER — IOHEXOL 300 MG/ML  SOLN
75.0000 mL | Freq: Once | INTRAMUSCULAR | Status: AC | PRN
  Administered 2010-09-10: 75 mL via INTRAVENOUS

## 2010-09-14 ENCOUNTER — Emergency Department (HOSPITAL_COMMUNITY)
Admission: EM | Admit: 2010-09-14 | Discharge: 2010-09-14 | Disposition: A | Discharge status: Home / Self Care | Payer: Medicaid Other | Attending: Emergency Medicine | Admitting: Emergency Medicine

## 2010-09-14 DIAGNOSIS — M549 Dorsalgia, unspecified: Secondary | ICD-10-CM | POA: Insufficient documentation

## 2010-09-14 DIAGNOSIS — M542 Cervicalgia: Secondary | ICD-10-CM | POA: Insufficient documentation

## 2010-09-14 DIAGNOSIS — Z79899 Other long term (current) drug therapy: Secondary | ICD-10-CM | POA: Insufficient documentation

## 2010-09-14 DIAGNOSIS — I10 Essential (primary) hypertension: Secondary | ICD-10-CM | POA: Insufficient documentation

## 2010-09-14 DIAGNOSIS — G8929 Other chronic pain: Secondary | ICD-10-CM | POA: Insufficient documentation

## 2010-09-14 DIAGNOSIS — R112 Nausea with vomiting, unspecified: Secondary | ICD-10-CM | POA: Insufficient documentation

## 2010-09-14 DIAGNOSIS — R197 Diarrhea, unspecified: Secondary | ICD-10-CM | POA: Insufficient documentation

## 2010-09-14 LAB — DIFFERENTIAL
Basophils Absolute: 0 10*3/uL (ref 0.0–0.1)
Lymphocytes Relative: 29 % (ref 12–46)
Neutro Abs: 3.5 10*3/uL (ref 1.7–7.7)

## 2010-09-14 LAB — CBC
HCT: 37.6 % (ref 36.0–46.0)
Hemoglobin: 12.9 g/dL (ref 12.0–15.0)
RBC: 4.36 MIL/uL (ref 3.87–5.11)
WBC: 5.6 10*3/uL (ref 4.0–10.5)

## 2010-09-14 LAB — URINALYSIS, ROUTINE W REFLEX MICROSCOPIC
Nitrite: NEGATIVE
Specific Gravity, Urine: 1.007 (ref 1.005–1.030)
pH: 7.5 (ref 5.0–8.0)

## 2010-09-14 LAB — POCT I-STAT, CHEM 8
Chloride: 106 mEq/L (ref 96–112)
Glucose, Bld: 101 mg/dL — ABNORMAL HIGH (ref 70–99)
HCT: 40 % (ref 36.0–46.0)
Potassium: 3.8 mEq/L (ref 3.5–5.1)

## 2010-09-15 NOTE — Letter (Signed)
Summary: MAILED REQUESTED RECORDS TO [facility]  MAILED REQUESTED RECORDS TO [facility]   Imported By: Arta Bruce 09/09/2010 16:44:18  _____________________________________________________________________  External Attachment:    Type:   Image     Comment:   External Document

## 2010-09-17 ENCOUNTER — Emergency Department (HOSPITAL_COMMUNITY): Payer: Medicaid Other

## 2010-09-17 ENCOUNTER — Emergency Department (HOSPITAL_COMMUNITY)
Admission: EM | Admit: 2010-09-17 | Discharge: 2010-09-18 | Disposition: A | Discharge status: Home / Self Care | Payer: Medicaid Other | Attending: Emergency Medicine | Admitting: Emergency Medicine

## 2010-09-17 DIAGNOSIS — X58XXXA Exposure to other specified factors, initial encounter: Secondary | ICD-10-CM | POA: Insufficient documentation

## 2010-09-17 DIAGNOSIS — M25559 Pain in unspecified hip: Secondary | ICD-10-CM | POA: Insufficient documentation

## 2010-09-17 DIAGNOSIS — IMO0002 Reserved for concepts with insufficient information to code with codable children: Secondary | ICD-10-CM | POA: Insufficient documentation

## 2010-09-17 DIAGNOSIS — I10 Essential (primary) hypertension: Secondary | ICD-10-CM | POA: Insufficient documentation

## 2010-09-22 ENCOUNTER — Encounter: Payer: Self-pay | Admitting: Internal Medicine

## 2010-09-22 ENCOUNTER — Encounter (INDEPENDENT_AMBULATORY_CARE_PROVIDER_SITE_OTHER): Payer: Self-pay | Admitting: Internal Medicine

## 2010-09-24 ENCOUNTER — Emergency Department (HOSPITAL_COMMUNITY)
Admission: EM | Admit: 2010-09-24 | Discharge: 2010-09-24 | Disposition: A | Discharge status: Home / Self Care | Payer: Medicaid Other | Attending: Emergency Medicine | Admitting: Emergency Medicine

## 2010-09-24 DIAGNOSIS — Z79899 Other long term (current) drug therapy: Secondary | ICD-10-CM | POA: Insufficient documentation

## 2010-09-24 DIAGNOSIS — R350 Frequency of micturition: Secondary | ICD-10-CM | POA: Insufficient documentation

## 2010-09-24 DIAGNOSIS — M545 Low back pain, unspecified: Secondary | ICD-10-CM | POA: Insufficient documentation

## 2010-09-24 DIAGNOSIS — R1110 Vomiting, unspecified: Secondary | ICD-10-CM | POA: Insufficient documentation

## 2010-09-24 DIAGNOSIS — G8929 Other chronic pain: Secondary | ICD-10-CM | POA: Insufficient documentation

## 2010-09-24 DIAGNOSIS — I10 Essential (primary) hypertension: Secondary | ICD-10-CM | POA: Insufficient documentation

## 2010-09-24 LAB — POCT I-STAT, CHEM 8
BUN: 12 mg/dL (ref 6–23)
Sodium: 143 mEq/L (ref 135–145)
TCO2: 25 mmol/L (ref 0–100)

## 2010-09-24 LAB — URINALYSIS, ROUTINE W REFLEX MICROSCOPIC
Bilirubin Urine: NEGATIVE
Hgb urine dipstick: NEGATIVE
Ketones, ur: NEGATIVE mg/dL
Nitrite: NEGATIVE
Protein, ur: NEGATIVE mg/dL
Specific Gravity, Urine: 1.006 (ref 1.005–1.030)
Urine Glucose, Fasting: NEGATIVE mg/dL
Urobilinogen, UA: 0.2 mg/dL (ref 0.0–1.0)
pH: 6.5 (ref 5.0–8.0)

## 2010-09-27 NOTE — Assessment & Plan Note (Signed)
Summary: f/u discomfort left side of neck   Vital Signs:  Patient profile:   55 year old female Menstrual status:  postmenopausal Weight:      139.19 pounds Temp:     98.1 degrees F oral Pulse rate:   86 / minute Pulse rhythm:   regular Resp:     18 per minute BP sitting:   132 / 86  (left arm)  Vitals Entered By: Hale Drone CMA (September 22, 2010 12:40 PM) CC: f/u from [facility] hosp for left neck pain and right hip pain. Her Doctor at [facility]  would like  her to be referred to PT.  Is Patient Diabetic? No Pain Assessment Patient in pain? yes       Does patient need assistance? Functional Status Self care Ambulation Normal     Menstrual Status postmenopausal Last PAP Result NEGATIVE FOR INTRAEPITHELIAL LESIONS OR MALIGNANCY.   CC:  f/u from [facility] hosp for left neck pain and right hip pain. Her Doctor at [facility]  would like  her to be referred to PT. Marland Kitchen  History of Present Illness: 1. Low back Pain:  has been  seen at [facility]--NS did not feel there was any surgical problem.  She was sent to Dr. Reece Levy multidisiplinary clinic.  They wanted her to start PT here in [place] and looked like they were setting that up, but for some reason, not done.  Pt.'s niece sent in paperwork for PT recommendations.  Contemplating more  spine injections.  Goes back to [facility] 10/19/10.  Per pt. the [facility] physicians are not aware that she has not yet started PT--she never notified.  States has  had problem as she has Medicaid.  Current Medications (verified): 1)  Lisinopril 10 Mg  Tabs (Lisinopril) .... Take One Tablet Once Daily 2)  Zoloft 50 Mg Tabs (Sertraline Hcl) .... 2 Tabs By Mouth Daily 3)  Cyclobenzaprine Hcl 10 Mg Tabs (Cyclobenzaprine Hcl) .Marland Kitchen.. 1 By Mouth Q 8hour As Needed 4)  Hydrocodone-Acetaminophen 5-500 Mg Tabs (Hydrocodone-Acetaminophen) .Marland Kitchen.. 1 Tab By Mouth Up To Two Times A Day As Needed For Pain  Allergies (verified): No Known Drug Allergies  Physical Exam  General:   Sits on bed appearing uncomfortable. Msk:  Tender in left low back.   Impression & Recommendations:  Problem # 1:  BACK PAIN, LUMBAR (ICD-724.2)  Pt. to be set up with PT soon. She is to notify [facility] physicians that she has not yet started PT. Needs to call Radiology and get MRI disc to take with her to next [facility] appt.  Complete Medication List: 1)  Lisinopril 10 Mg Tabs (Lisinopril) .... Take one tablet once daily 2)  Zoloft 50 Mg Tabs (Sertraline hcl) .... 2 tabs by mouth daily 3)  Cyclobenzaprine Hcl 10 Mg Tabs (Cyclobenzaprine hcl) .Marland Kitchen.. 1 by mouth q 8hour as needed 4)  Hydrocodone-acetaminophen 5-500 Mg Tabs (Hydrocodone-acetaminophen) .Marland Kitchen.. 1 tab by mouth up to two times a day as needed for pain  Other Orders: Physical Therapy Referral (PT)  Patient Instructions: 1)  Call Dr. Jeremy Johann at [facility] at let his office know you are just getting started with PT 2)  Call Radiology and arrange to pick up MRI disc for Dr. Jeremy Johann to see at next appt.   Orders Added: 1)  Physical Therapy Referral [PT] 2)  Est. Patient Level II [40981]   Not Administered:    Influenza Vaccine not given due to: declined

## 2010-09-29 ENCOUNTER — Inpatient Hospital Stay (INDEPENDENT_AMBULATORY_CARE_PROVIDER_SITE_OTHER)
Admission: RE | Admit: 2010-09-29 | Discharge: 2010-09-29 | Disposition: A | Discharge status: Home / Self Care | Payer: Medicaid Other | Source: Ambulatory Visit | Attending: Family Medicine | Admitting: Family Medicine

## 2010-09-29 DIAGNOSIS — R070 Pain in throat: Secondary | ICD-10-CM

## 2010-09-30 LAB — STREP A DNA PROBE

## 2010-10-06 ENCOUNTER — Ambulatory Visit: Payer: Medicaid Other | Attending: Internal Medicine | Admitting: Physical Therapy

## 2010-10-06 DIAGNOSIS — M2560 Stiffness of unspecified joint, not elsewhere classified: Secondary | ICD-10-CM | POA: Insufficient documentation

## 2010-10-06 DIAGNOSIS — IMO0001 Reserved for inherently not codable concepts without codable children: Secondary | ICD-10-CM | POA: Insufficient documentation

## 2010-10-06 DIAGNOSIS — M6281 Muscle weakness (generalized): Secondary | ICD-10-CM | POA: Insufficient documentation

## 2010-10-06 DIAGNOSIS — M2550 Pain in unspecified joint: Secondary | ICD-10-CM | POA: Insufficient documentation

## 2010-10-11 ENCOUNTER — Encounter: Payer: Self-pay | Admitting: Internal Medicine

## 2010-10-11 ENCOUNTER — Telehealth (INDEPENDENT_AMBULATORY_CARE_PROVIDER_SITE_OTHER): Payer: Self-pay | Admitting: Internal Medicine

## 2010-10-11 ENCOUNTER — Encounter (INDEPENDENT_AMBULATORY_CARE_PROVIDER_SITE_OTHER): Payer: Self-pay | Admitting: Internal Medicine

## 2010-10-11 DIAGNOSIS — M549 Dorsalgia, unspecified: Secondary | ICD-10-CM | POA: Insufficient documentation

## 2010-10-14 ENCOUNTER — Ambulatory Visit: Payer: Medicaid Other | Admitting: Physical Therapy

## 2010-10-16 LAB — URINE CULTURE
Colony Count: NO GROWTH
Culture: NO GROWTH

## 2010-10-16 LAB — CBC
HCT: 37.9 % (ref 36.0–46.0)
Hemoglobin: 13.1 g/dL (ref 12.0–15.0)
MCV: 90 fL (ref 78.0–100.0)
RBC: 4.22 MIL/uL (ref 3.87–5.11)
WBC: 5.7 10*3/uL (ref 4.0–10.5)

## 2010-10-16 LAB — URINALYSIS, ROUTINE W REFLEX MICROSCOPIC
Bilirubin Urine: NEGATIVE
Glucose, UA: NEGATIVE mg/dL
Hgb urine dipstick: NEGATIVE
Ketones, ur: NEGATIVE mg/dL
Protein, ur: NEGATIVE mg/dL

## 2010-10-16 LAB — COMPREHENSIVE METABOLIC PANEL
Alkaline Phosphatase: 33 U/L — ABNORMAL LOW (ref 39–117)
BUN: 9 mg/dL (ref 6–23)
CO2: 30 mEq/L (ref 19–32)
Chloride: 102 mEq/L (ref 96–112)
Creatinine, Ser: 0.67 mg/dL (ref 0.4–1.2)
GFR calc non Af Amer: >60 mL/min (ref >60)
Potassium: 3.7 mEq/L (ref 3.5–5.1)
Total Bilirubin: 0.2 mg/dL — ABNORMAL LOW (ref 0.3–1.2)

## 2010-10-16 LAB — DIFFERENTIAL
Basophils Absolute: 0 10*3/uL (ref 0.0–0.1)
Basophils Relative: 0 % (ref 0–1)
Eosinophils Relative: 2 % (ref 0–5)
Lymphocytes Relative: 38 % (ref 12–46)
Neutro Abs: 2.9 10*3/uL (ref 1.7–7.7)

## 2010-10-18 ENCOUNTER — Ambulatory Visit: Payer: Medicaid Other | Admitting: Physical Therapy

## 2010-10-18 LAB — POCT I-STAT, CHEM 8
BUN: 15 mg/dL (ref 6–23)
Chloride: 107 mEq/L (ref 96–112)
Creatinine, Ser: 0.6 mg/dL (ref 0.4–1.2)
Potassium: 4.1 mEq/L (ref 3.5–5.1)
Sodium: 141 mEq/L (ref 135–145)
TCO2: 25 mmol/L (ref 0–100)

## 2010-10-18 LAB — DIFFERENTIAL
Basophils Absolute: 0 10*3/uL (ref 0.0–0.1)
Eosinophils Relative: 1 % (ref 0–5)
Lymphocytes Relative: 35 % (ref 12–46)
Lymphs Abs: 2.8 10*3/uL (ref 0.7–4.0)
Neutro Abs: 4.7 10*3/uL (ref 1.7–7.7)

## 2010-10-18 LAB — CBC
HCT: 39.1 % (ref 36.0–46.0)
Hemoglobin: 13.6 g/dL (ref 12.0–15.0)
Platelets: 223 10*3/uL (ref 150–400)
RDW: 14.6 % (ref 11.5–15.5)
WBC: 8.1 10*3/uL (ref 4.0–10.5)

## 2010-10-18 LAB — POCT CARDIAC MARKERS
CKMB, poc: <1 ng/mL — ABNORMAL LOW (ref 1.0–8.0)
Myoglobin, poc: 30.3 ng/mL (ref 12–200)
Troponin i, poc: <0.05 ng/mL (ref 0.00–0.09)

## 2010-10-18 NOTE — Assessment & Plan Note (Signed)
Summary: Sore throat, pain on side   Vital Signs:  Patient profile:   55 year old female Menstrual status:  postmenopausal Weight:      135.31 pounds BMI:     25.24 Temp:     97.8 degrees F oral Pulse rate:   72 / minute Pulse rhythm:   regular Resp:     18 per minute BP sitting:   138 / 86  (left arm) Cuff size:   regular  Vitals Entered By: Hale Drone CMA (October 11, 2010 12:03 PM) CC: Went to [facility] urgent care for pain on left side of neck... the pain radiates upwards to her left sid of head... Would like to be referred to [facility] ENT.  Is Patient Diabetic? No Pain Assessment Patient in pain? yes       Does patient need assistance? Functional Status Self care Ambulation Normal   CC:  Went to [facility] urgent care for pain on left side of neck... the pain radiates upwards to her left sid of head... Would like to be referred to [facility] ENT. Marland Kitchen  History of Present Illness: Left neck pain: Sharp pain.   went to ED recently for this--negative for throat infection.  Was told to be seen by ENT.  Pain has been a problem for 2 months.  Pain radiates up onto temporal/parietal head.  Pain is constant.  Hydrocodone helps, but vomits when takes med.  Switched to plain Tylenol and helps without nausea--1000 mg.  Takes every 6 to 8 hours.  Has started with PT, but they are only working with her low back currently.  Cyclobenzaprine does help and allows her to sleep--has refills.   Current Medications (verified): 1)  Lisinopril 10 Mg  Tabs (Lisinopril) .... Take One Tablet Once Daily 2)  Zoloft 50 Mg Tabs (Sertraline Hcl) .... 2 Tabs By Mouth Daily 3)  Cyclobenzaprine Hcl 10 Mg Tabs (Cyclobenzaprine Hcl) .Marland Kitchen.. 1 By Mouth Q 8hour As Needed 4)  Hydrocodone-Acetaminophen 5-500 Mg Tabs (Hydrocodone-Acetaminophen) .Marland Kitchen.. 1 Tab By Mouth Up To Two Times A Day As Needed For Pain  Allergies (verified): No Known Drug Allergies  Physical Exam  General:  NAD Neck:  No adenopathy.  No mass. Msk:  Discrete tenderness  on palpation of SCM muscle on left--from origin to insertion.  Tender over left parietal and temporal scalp as well as nuchal ridge and cervical/thoracic paraspinous musculature.   Impression & Recommendations:  Problem # 1:  BACK PAIN (ICD-724.5)  Her updated medication list for this problem includes:    Cyclobenzaprine Hcl 10 Mg Tabs (Cyclobenzaprine hcl) .Marland Kitchen... 1 by mouth q 8hour as needed    Hydrocodone-acetaminophen 5-500 Mg Tabs (Hydrocodone-acetaminophen) .Marland Kitchen... 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 Pt to call when out of muscle relaxant Orders: Physical Therapy Referral (PT)  Complete Medication List: 1)  Lisinopril 10 Mg Tabs (Lisinopril) .... Take one tablet once daily 2)  Zoloft 50 Mg Tabs (Sertraline hcl) .... 2 tabs by mouth daily 3)  Cyclobenzaprine Hcl 10 Mg Tabs (Cyclobenzaprine hcl) .Marland Kitchen.. 1 by mouth q 8hour as needed 4)  Hydrocodone-acetaminophen 5-500 Mg Tabs (Hydrocodone-acetaminophen) .Marland Kitchen.. 1 tab by mouth up to two times a day as needed for pain  Patient Instructions: 1)  Call if your physical therapist does not get started on neck and upper back therapy 2)  Keep your follow up OV--should already have one scheduled regarding your low back therapy   Orders Added: 1)  Est. Patient Level III [16109] 2)  Physical Therapy Referral [PT]

## 2010-10-18 NOTE — Progress Notes (Signed)
Summary: Addition to Physical therapy treatment  Phone Note Outgoing Call   Summary of Call: NOra--addition to PT already doing--now 2 other areas need to address. Initial call taken by: Julieanne Manson MD,  October 11, 2010 1:12 PM  Follow-up for Phone Call        SENT A REFERRAL TO [facility] .THEY WILL CALL THEPT TO SCHEDULE AN APPT Follow-up by: Cheryll Dessert,  October 11, 2010 3:07 PM

## 2010-10-18 NOTE — Miscellaneous (Signed)
Summary: Rehab Report//INITIAL SUMMARY  Rehab Report//INITIAL SUMMARY   Imported By: Arta Bruce 10/12/2010 11:14:22  _____________________________________________________________________  External Attachment:    Type:   Image     Comment:   External Document

## 2010-10-28 ENCOUNTER — Ambulatory Visit: Payer: Medicaid Other | Admitting: Physical Therapy

## 2010-11-02 LAB — COMPREHENSIVE METABOLIC PANEL
ALT: 20 U/L (ref 0–35)
AST: 23 U/L (ref 0–37)
Albumin: 3.8 g/dL (ref 3.5–5.2)
CO2: 28 mEq/L (ref 19–32)
Chloride: 101 mEq/L (ref 96–112)
Creatinine, Ser: 0.8 mg/dL (ref 0.4–1.2)
GFR calc Af Amer: >60 mL/min (ref >60)
GFR calc non Af Amer: >60 mL/min (ref >60)
Potassium: 4 mEq/L (ref 3.5–5.1)
Sodium: 140 mEq/L (ref 135–145)
Total Bilirubin: 0.3 mg/dL (ref 0.3–1.2)

## 2010-11-02 LAB — URINALYSIS, ROUTINE W REFLEX MICROSCOPIC
Bilirubin Urine: NEGATIVE
Glucose, UA: NEGATIVE mg/dL
Hgb urine dipstick: NEGATIVE
Nitrite: NEGATIVE
Specific Gravity, Urine: 1.004 — ABNORMAL LOW (ref 1.005–1.030)
pH: 7 (ref 5.0–8.0)

## 2010-11-02 LAB — DIFFERENTIAL
Basophils Absolute: 0 10*3/uL (ref 0.0–0.1)
Eosinophils Absolute: 0.2 10*3/uL (ref 0.0–0.7)
Eosinophils Relative: 3 % (ref 0–5)
Lymphocytes Relative: 40 % (ref 12–46)
Monocytes Absolute: 0.4 10*3/uL (ref 0.1–1.0)

## 2010-11-02 LAB — CBC
MCV: 88.7 fL (ref 78.0–100.0)
Platelets: 194 10*3/uL (ref 150–400)
RBC: 4.26 MIL/uL (ref 3.87–5.11)
WBC: 5.2 10*3/uL (ref 4.0–10.5)

## 2010-11-02 LAB — URINE MICROSCOPIC-ADD ON

## 2010-11-02 LAB — LIPASE, BLOOD: Lipase: 16 U/L (ref 11–59)

## 2010-11-05 LAB — DIFFERENTIAL
Eosinophils Absolute: 0.1 10*3/uL (ref 0.0–0.7)
Lymphs Abs: 2.5 10*3/uL (ref 0.7–4.0)
Monocytes Relative: 7 % (ref 3–12)
Neutro Abs: 2.4 10*3/uL (ref 1.7–7.7)
Neutrophils Relative %: 44 % (ref 43–77)

## 2010-11-05 LAB — BASIC METABOLIC PANEL
Chloride: 105 mEq/L (ref 96–112)
Creatinine, Ser: 0.64 mg/dL (ref 0.4–1.2)
GFR calc Af Amer: >60 mL/min (ref >60)

## 2010-11-05 LAB — PROTIME-INR: INR: 1 (ref 0.00–1.49)

## 2010-11-05 LAB — URINE MICROSCOPIC-ADD ON

## 2010-11-05 LAB — URINALYSIS, ROUTINE W REFLEX MICROSCOPIC
Nitrite: NEGATIVE
Specific Gravity, Urine: 1.009 (ref 1.005–1.030)
pH: 7.5 (ref 5.0–8.0)

## 2010-11-05 LAB — CBC
MCV: 90.9 fL (ref 78.0–100.0)
RBC: 4.4 MIL/uL (ref 3.87–5.11)
WBC: 5.5 10*3/uL (ref 4.0–10.5)

## 2010-11-09 ENCOUNTER — Encounter: Payer: Medicaid Other | Admitting: Physical Therapy

## 2010-12-13 NOTE — Group Therapy Note (Signed)
NAMEEISLEY, BARBER NO.:  [id number]   MEDICAL RECORD NO.:  [id number]          PATIENT TYPE:  WOC   LOCATION:  [facility]                   FACILITY:  [facility]   PHYSICIAN:  Melissa Donovan, MD        DATE OF BIRTH:  Dec 04, 1955   DATE OF SERVICE:                                  CLINIC NOTE   HISTORY OF PRESENT ILLNESS:  [name] is a 55 year old female who we  saw in the GYN clinic on October 03, 2007, for results of her colposcopy  and biopsy.  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  She was very concerned  at that time about HPV but we discussed it in detail with her.  Today  she presents worried about some post coital bleeding and some greenish  discharge x7 days.  She states that she also feels like she is burning  up inside.  The patient started menopause in August 2008.  She states  she has never tried any estrogen cream.   PHYSICAL EXAMINATION:  VITAL SIGNS:  Temperature 97.9, pulse is 80,  blood pressure is 129/92, weight is 138.1.  GENERAL:  She is a very pleasant but appears worried female in no acute  distress.  PELVIC:  Vagina and cervix are both clean.  I do not appreciate any  malodor or abnormal discharge.   ASSESSMENT:  This is a 55 year old female who presents with vaginal  burning and discharge along with post coital bleeding.   PLAN:  1. Post coital bleeding may be secondary to where she had her      colposcopy biopsy.  It may also be secondary to her starting      menopause.  We will give some Estrace cream.  2. Discharge and vaginal itching and burning.  No obvious signs of      infection but we will get a wet prep and follow for GC/Chlamydia.           ______________________________  Melissa Donovan, MD     PR/MEDQ  D:  10/17/2007  T:  10/17/2007  Job:  [id number]

## 2010-12-13 NOTE — Op Note (Signed)
Melissa Jones, Melissa Jones             ACCOUNT NO.:  [id number]   MEDICAL RECORD NO.:  [id number]          PATIENT TYPE:  OIB   LOCATION:  5158                         FACILITY:  [facility]   PHYSICIAN:  Velora Heckler, MD      DATE OF BIRTH:  Dec 26, 1955   DATE OF PROCEDURE:  03/11/2009  DATE OF DISCHARGE:                               OPERATIVE REPORT   PREOPERATIVE DIAGNOSIS:  Primary hyperparathyroidism.   POSTOPERATIVE DIAGNOSIS:  Primary hyperparathyroidism.   PROCEDURE:  Left inferior minimally invasive parathyroidectomy.   SURGEON:  Velora Heckler, MD, FACS   ASSISTANT:  Currie Paris, MD, FACS   ANESTHESIA:  General per Dr. Arta Bruce.   ESTIMATED BLOOD LOSS:  Minimal.   PREPARATION:  ChloraPrep.   COMPLICATIONS:  None.   INDICATIONS:  The patient is a 55 year old Hispanic female referred by  Dr. Beverley Fiedler at [facility] for primary  hyperparathyroidism.  The patient was noted to have hypertension as well  as aches and pains in the joints of the lower extremities.  She had  routine laboratory studies, which showed an elevated calcium level  greater than 11.  Intact parathyroid hormone level was elevated at 131.  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  The  patient now comes to Surgery for resection.   BODY OF REPORT:  Procedure was done in OR #16 at the [facility].  The patient was brought to the operating room and  placed in supine position on the operating room table.  Following  administration of general anesthesia, the patient was positioned and  then prepped and draped in usual strict aseptic fashion.  After  ascertaining that, an adequate level of anesthesia had been achieved, a  low left neck incision was made with #15 blade.  Dissection was carried  through subcutaneous tissues.  Hemostasis was obtained with the  electrocautery.  Platysma was divided and subplatysmal flaps were  developed circumferentially.  Strap muscles were incised in the midline.  External jugular vein was divided between medium Ligaclips.  Dissection  was carried down to the trachea.  Strap muscles were reflected to the  left.  Inferior pole of the thyroid gland was identified.  Dissection in  the tracheoesophageal groove allows for identification of the recurrent  laryngeal nerve.  There was an enlarged parathyroid gland just at the  inferior pole of the thyroid.  This was gently dissected out.  Vascular  pedicle was divided between small Ligaclips.  Gland was excised in its  entirety.  It measures less than a centimeter in diameter.  It is  submitted in its entirety to pathology where frozen section by Dr.  Pecola Leisure confirms parathyroid tissue consistent with parathyroid  adenoma.   Good hemostasis was noted.  Surgicel was placed in the operative field.  Strap muscles were reapproximated in the midline with interrupted 3-0  Vicryl sutures.  Platysma was closed with interrupted 3-0 Vicryl  sutures.  Local field block was placed with Marcaine.  Skin was closed  with a running 4-0 Monocryl subcuticular suture.  Wound was washed and  dried and Benzoin Steri-Strips were applied.  Sterile dressings were  applied.  The patient was awakened from the anesthesia and brought to  the recovery room in stable condition.  The patient tolerated the  procedure well.      Velora Heckler, MD  Electronically Signed     Velora Heckler, MD  Electronically Signed    TMG/MEDQ  D:  03/11/2009  T:  03/12/2009  Job:  [id number]   cc:   Fanny Dance. Rankins, M.D.

## 2010-12-13 NOTE — Group Therapy Note (Signed)
Melissa, Jones NO.:  [id number]   MEDICAL RECORD NO.:  [id number]          PATIENT TYPE:  WOC   LOCATION:  [facility]                   FACILITY:  [facility]   PHYSICIAN:  [name], MD        DATE OF BIRTH:  01/21/1956   DATE OF SERVICE:                                  CLINIC NOTE   REASON FOR VISIT:  Follow-up Pap, status post previous abnormal Pap with  follow-up colposcopy.  The colposcopy was performed in February 2009,  and showed koilocytic atypia consistent with HPV and the cervical  curettings showed scant benign endocervix at that time.  She is here for  a routine six-month follow-up Pap smear.   SUBJECTIVE:  The patient presents asking questions about HPV and its  significance.  She wonders whether or not it is a sexually transmitted  infection.  She states that she has had some vaginal dryness, vaginal  itching both externally and internally.  She has not had a period in  over one year.   PHYSICAL EXAMINATION:  VITAL SIGNS:  She is afebrile, blood pressure  134/91, pulse 68, weight 141 pounds, height 5 feet 3 inches, respiratory  rate 20.  GENERAL:  She is a well-appearing female, in no acute distress.  ABDOMEN:  Soft, nontender.  No masses are palpated.  GENITOURINARY:  The patient has normal external female genitalia.  External vagina looks mildly dry, but there is no obvious erythema or  signs of active infection.  PELVIC EXAM:  Internal exam shows pink vaginal mucosa that does appear  somewhat dry.  There is no erythema.  There is no discharge.  She is not  overly tender to manual examination.  A Pap smear is performed.  GC and  Chlamydia are obtained.  Wet prep is obtained as well.  BIMANUAL EXAM:  Reveals a firm fundus that is mobile.  Adnexa normal to  palpation bilaterally.  There is no cervical motion tenderness.  Cervical os is inspected and is seen as normal.  SKIN:  Intact with normal color, good turgor, warm and well-perfused.   ASSESSMENT/PLAN:  The patient is a 55 year old female with a history of  abnormal Papanicolaou smear.  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  This  was followed by a colposcopy which showed evidence of human  papillomavirus and koilocytic atypia.  Endocervical curettings obtained  at the time of this examination showed abundant mucus and scant benign  endocervix.  1. A follow-up Papanicolaou smear is performed today.  We will contact      the patient in approximately one week either by phone or by mail      with the results of this Papanicolaou smear.  A wet prep, gonorrhea      and Chlamydia obtained as well, although it is felt that an ongoing      infection is unlikely.  2. Vaginal dryness and itching:  This is most likely due to the      patient's menopausal state.  We will treat her with Esterase cream      inter-vaginally three  times a week for her vaginal itching.  She      previously threw her last prescription away and has not been using      this cream at this point, and that her symptoms are probably due to      dryness associated with menopause and that this cream would be of      help.  The patient expressed agreement and understanding with this      plan of action.  3. The patient should return in six months for a repeat follow-up Pap      smear.  If the results of this Pap smear and the next Pap smear are      negative, we can likely space her out to every 25-month Pap smears.     ______________________________  [name], MD    ______________________________  [name], MD    TE/MEDQ  D:  04/09/2008  T:  04/09/2008  Job:  [id number]

## 2010-12-13 NOTE — Discharge Summary (Signed)
NAMEMARYN, FREELOVE             ACCOUNT NO.:  [id number]   MEDICAL RECORD NO.:  [id number]          PATIENT TYPE:  WOC   LOCATION:  WOC                          FACILITY:  [facility]   PHYSICIAN:  Phil D. Okey Dupre, M.D.     DATE OF BIRTH:  Feb 26, 1956   DATE OF ADMISSION:  10/03/2007  DATE OF DISCHARGE:                               DISCHARGE SUMMARY   CHIEF COMPLAINT:  Follow up results of colpo/biopsy.   HISTORY OF PRESENT ILLNESS:  This is a very pleasant 55 year old female  who presents for a followup of her colpo/biopsy.  Her Pap had shown in  December, 2008 ASCUS with HPV.  She proceeded to have a colposcopy in  February.  The colposcopy showed koilocytic atypia consistent with HPV.  The patient has numerous questions about what this meant.  We discussed  in great detail.  Recommended that she have a repeat Pap in six months  and then another repeat Pap six months after that.  If both of those are  okay, she can go ahead and space them out to every year.      Ruthe Mannan, M.D.      Phil D. Okey Dupre, M.D.  Electronically Signed    TA/MEDQ  D:  10/03/2007  T:  10/03/2007  Job:  [id number]

## 2010-12-13 NOTE — Assessment & Plan Note (Signed)
[facility]                            CARDIOLOGY OFFICE NOTE   Melissa Jones                      MRN:          [id number]  DATE:07/04/2007                            DOB:          July 29, 1956    Melissa Jones is seen today as a new consult for [facility] ER.  She was  seen in the emergency room June 05, 2007 for atypical chest pain.  She ruled out for myocardial infarction.  Chest x-ray was normal.  There  are no acute changes in her EKG.  She has had pain a few times in the  past.  She has never had a workup.  The pain on the fifth, which  continues today, is atypical and is not exertional.  She had a bad  episode last night that woke her up from sleep.  It actually sounds more  musculoskeletal, starting in the left neck area, radiating to her  shoulder.  There is pain with rotation of the neck and lifting her arm.  Her history is a bit difficult to get, as she speaks very little  [place].  However, I did have a nurse who speaks Spanish in the room  with me.  There is no pleuritic component.  There has not been any  coughing, sputum, or fever.   The pain is not clearly exertional.  As indicated, she can get it just  lying around or when she gets up from sleep.   She does not recall any significant episodes of strained muscles or  lifting heavy weights.   The patient does not work.  She used to do housekeeping.   The patient's past medical history is relatively benign.  She was  started on blood pressure medication when she was discharged from the  hospital.  She is a nonsmoker.  Cholesterol status is not known.  She is  a non-diabetic.   FAMILY HISTORY:  Negative.   The patient's past medical history is fairly unremarkable.  She has  never had surgery.  She apparently does have some hypertension.   I will have to get records from the ER to see what she was started on.   The patient has no known drug allergies.   She is  originally from [place]  She is married.  Her husband is  also on disability and does not work.  She does not have any children.   She does not drink excessively.   EXAM:  Remarkable for a middle-aged Spanish-speaking female in no  distress.  Blood pressure 140/80, pulse 70 and regular.  She is afebrile.  Respiratory rate is 14.  HEENT:  Unremarkable.  NECK:  Supple.  No lymphadenopathy or thyromegaly.  No JVP elevation.  LUNGS:  Clear with good diaphragmatic motion.  No wheezing.  S1, S2 with normal heart sounds.  PMI normal.  ABDOMEN:  Benign.  Bowel sounds positive.  No tenderness.  No  hepatosplenomegaly.  No hepatojugular reflux.  Distal pulses intact.  No edema.  NEURO:  Nonfocal.  No muscular weakness.  SKIN:  Warm and dry.   IMPRESSION:  1. Atypical chest pain.  The patient has limited means.  I do not know      that she has insurance.  I think the easiest test to do would be a      stress echo.  We will try to arrange this for next week.  I will      check her EKG today, since I do not have one from the ER.  She was      encouraged to continue taking nonsteroidals.  2. 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  Continue Flexeril, which      was apparently prescribed in the ER, as well as antiinflammatories.      Follow up with [facility].  3. Hypertension.  Continue medications prescribed in the ER.  I      suspect it was an ACE inhibitor.  Her blood pressure seems well      controlled.  Continue low salt diet.   As long as her stress echo is normal, she will not need further cardiac  followup.   ADDENDUM:  Her EKG today was entirely normal sinus rhythm at 80 with no  evidence of LVH.     Melissa Jones. Eden Emms, MD, [facility]  Electronically Signed    PCN/MedQ  DD: 07/04/2007  DT: 07/04/2007  Job #: [id number]

## 2011-03-08 ENCOUNTER — Other Ambulatory Visit: Payer: Self-pay | Admitting: Internal Medicine

## 2011-03-08 DIAGNOSIS — Z1231 Encounter for screening mammogram for malignant neoplasm of breast: Secondary | ICD-10-CM

## 2011-03-14 ENCOUNTER — Ambulatory Visit (HOSPITAL_COMMUNITY)
Admission: RE | Admit: 2011-03-14 | Discharge: 2011-03-14 | Disposition: A | Discharge status: Home / Self Care | Payer: Medicaid Other | Source: Ambulatory Visit | Attending: Internal Medicine | Admitting: Internal Medicine

## 2011-03-14 DIAGNOSIS — Z1231 Encounter for screening mammogram for malignant neoplasm of breast: Secondary | ICD-10-CM | POA: Insufficient documentation

## 2011-03-18 ENCOUNTER — Inpatient Hospital Stay (INDEPENDENT_AMBULATORY_CARE_PROVIDER_SITE_OTHER)
Admission: RE | Admit: 2011-03-18 | Discharge: 2011-03-18 | Disposition: A | Discharge status: Home / Self Care | Payer: Medicaid Other | Source: Ambulatory Visit | Attending: Emergency Medicine | Admitting: Emergency Medicine

## 2011-03-18 DIAGNOSIS — N9489 Other specified conditions associated with female genital organs and menstrual cycle: Secondary | ICD-10-CM

## 2011-03-18 LAB — POCT URINALYSIS DIP (DEVICE)
Hgb urine dipstick: NEGATIVE
Protein, ur: NEGATIVE mg/dL
Specific Gravity, Urine: <=1.005 (ref 1.005–1.030)
Urobilinogen, UA: 0.2 mg/dL (ref 0.0–1.0)

## 2011-03-18 LAB — WET PREP, GENITAL
Trich, Wet Prep: NONE SEEN
WBC, Wet Prep HPF POC: NONE SEEN

## 2011-03-20 LAB — GC/CHLAMYDIA PROBE AMP, GENITAL
Chlamydia, DNA Probe: NEGATIVE
GC Probe Amp, Genital: NEGATIVE

## 2011-03-27 ENCOUNTER — Inpatient Hospital Stay (HOSPITAL_COMMUNITY): Payer: Medicaid Other

## 2011-03-27 ENCOUNTER — Inpatient Hospital Stay (HOSPITAL_COMMUNITY)
Admission: AD | Admit: 2011-03-27 | Discharge: 2011-03-27 | Disposition: A | Discharge status: Home / Self Care | Payer: Medicaid Other | Source: Ambulatory Visit | Attending: Obstetrics & Gynecology | Admitting: Obstetrics & Gynecology

## 2011-03-27 ENCOUNTER — Encounter (HOSPITAL_COMMUNITY): Payer: Self-pay | Admitting: *Deleted

## 2011-03-27 DIAGNOSIS — G8929 Other chronic pain: Secondary | ICD-10-CM | POA: Insufficient documentation

## 2011-03-27 DIAGNOSIS — M549 Dorsalgia, unspecified: Secondary | ICD-10-CM | POA: Insufficient documentation

## 2011-03-27 DIAGNOSIS — I10 Essential (primary) hypertension: Secondary | ICD-10-CM | POA: Insufficient documentation

## 2011-03-27 DIAGNOSIS — R102 Pelvic and perineal pain unspecified side: Secondary | ICD-10-CM

## 2011-03-27 DIAGNOSIS — R52 Pain, unspecified: Secondary | ICD-10-CM

## 2011-03-27 DIAGNOSIS — F329 Major depressive disorder, single episode, unspecified: Secondary | ICD-10-CM | POA: Insufficient documentation

## 2011-03-27 DIAGNOSIS — D259 Leiomyoma of uterus, unspecified: Secondary | ICD-10-CM

## 2011-03-27 DIAGNOSIS — N949 Unspecified condition associated with female genital organs and menstrual cycle: Secondary | ICD-10-CM | POA: Insufficient documentation

## 2011-03-27 DIAGNOSIS — F3289 Other specified depressive episodes: Secondary | ICD-10-CM | POA: Insufficient documentation

## 2011-03-27 HISTORY — DX: Other chronic pain: G89.29

## 2011-03-27 HISTORY — DX: Dorsalgia, unspecified: M54.9

## 2011-03-27 HISTORY — DX: Essential (primary) hypertension: I10

## 2011-03-27 HISTORY — DX: Major depressive disorder, single episode, unspecified: F32.9

## 2011-03-27 HISTORY — DX: Depression, unspecified: F32.A

## 2011-03-27 MED ORDER — KETOROLAC TROMETHAMINE 60 MG/2ML IM SOLN
60.0000 mg | Freq: Once | INTRAMUSCULAR | Status: AC
  Administered 2011-03-27: 60 mg via INTRAMUSCULAR
  Filled 2011-03-27: qty 2

## 2011-03-27 NOTE — ED Notes (Signed)
Patient very quiet, does not make eye contact. Difficult to obtain information and history. Pt. Now has turned her back on RN. Denies questions. Eda Royal present for interpretation.

## 2011-03-27 NOTE — Progress Notes (Signed)
Pt states she has been having sharp shooting pain in the left leg that radiates up into the abdomen and into the left side of her back. This pain has been there for 2 years and continues to have pain. Was seen at Urgent Care on 8-25 and told to follow up with a GYN to see if it is an ovary. Pt has not had a period since she was 50.

## 2011-03-27 NOTE — Progress Notes (Signed)
03/27/2011 Melissa Jones  Interpreter  I assisted Hope NP with questions.

## 2011-03-27 NOTE — Progress Notes (Signed)
03/27/2011 Gilman Olazabal  Interpreter  I assisted Darl Pikes RN with questions.

## 2011-03-27 NOTE — Progress Notes (Signed)
03/27/2011 Melissa Jones  Interpreter  I assisted Lupita Leash RN in triage

## 2011-03-27 NOTE — ED Notes (Signed)
EdaRoyal called pt.'s son per her request. He is coming to drive patient home.

## 2011-03-27 NOTE — ED Provider Notes (Signed)
History     CSN: [id number] Arrival date & time: 03/27/2011  9:00 AM  Chief Complaint  Patient presents with  . Leg Pain   The history is provided by the patient. The history is limited by a language barrier. A language interpreter was used.   Melissa Jones is a 55 y.o. female who presents to [facility] with pain in her LL abdomen that radiates to her back. The pain has been off and on for 2 years. She has had an MRI of her back in the past and states she is waiting for an apt. Time for another one. Over the past 3 weeks she has experienced increase pain and went to an Urgent Care a few days ago and was told to see a GYN to be sure there was no ovarian cyst or GYN problem before continuing with management for chronic back pain.   Past Medical History  Diagnosis Date  . Hypertension   . Chronic back pain   . Depression     Takes Zoloft occas., no regular schedule    Past Surgical History  Procedure Date  . Tubal ligation   . Thyroid cyst excision     No family history on file.  History  Substance Use Topics  . Smoking status: Never Smoker   . Smokeless tobacco: Never Used  . Alcohol Use: No    OB History    Grav Para Term Preterm Abortions TAB SAB Ect Mult Living   5 4 4  0 1 1 0 0 0 4      Review of Systems  Constitutional: Negative for fever, chills, diaphoresis and fatigue.  HENT: Negative for ear pain, congestion, sore throat, dental problem and sinus pressure.   Eyes: Negative for discharge.  Respiratory: Negative for cough and wheezing.   Gastrointestinal: Positive for nausea and abdominal pain. Negative for vomiting, diarrhea and constipation.  Genitourinary: Positive for pelvic pain. Negative for dysuria, frequency, flank pain, vaginal bleeding, vaginal discharge and difficulty urinating.  Musculoskeletal: Negative for myalgias, back pain and gait problem.  Skin: Negative for color change and rash.  Neurological: Negative for dizziness, weakness and headaches.    Psychiatric/Behavioral:       Depression    Physical Exam  BP 150/95  Pulse 78  Temp(Src) 98 F (36.7 C) (Oral)  Resp 16  Ht 5' 1.5" (1.562 m)  Wt 136 lb 3.2 oz (61.78 kg)  BMI 25.32 kg/m2  SpO2 99%  Physical Exam  Nursing note and vitals reviewed. Constitutional: She is oriented to person, place, and time. She appears well-developed and well-nourished.  HENT:  Head: Normocephalic and atraumatic.  Eyes: EOM are normal.  Neck: Neck supple.  Pulmonary/Chest: Effort normal.  Abdominal: Soft. There is tenderness in the left upper quadrant. There is no rebound, no guarding and no CVA tenderness.    Genitourinary:       Small amount of white vaginal discharge. No CMT mild left adnexal tenderness. Uterus without palpable enlargement.  Musculoskeletal: Normal range of motion.  Neurological: She is alert and oriented to person, place, and time. No cranial nerve deficit.  Skin: Skin is warm and dry.    ED Course  Procedures          US Transvaginal Non-ob  03/27/2011  *RADIOLOGY REPORT*  Clinical Data: Left lower quadrant pain.  Post menopausal female.  TRANSABDOMINAL AND TRANSVAGINAL ULTRASOUND OF PELVIS  Technique:  Both transabdominal and transvaginal ultrasound examinations of the pelvis were performed.  Transabdominal technique was  performed for global imaging of the pelvis including uterus, ovaries, adnexal regions, and pelvic cul-de-sac.  It was necessary to proceed with endovaginal exam following the transabdominal exam to visualize the endometrial stripe and ovaries.  Comparison:  01/12/2010  Findings: Uterus:  6.3 x 2.0 x 4.1 cm.  A small intramural fibroid is seen in the left posterior uterine body measuring 1.7 cm in maximum diameter.  No other fibroids are uterine masses are identified.  Endometrium: Measures 3 mm in double layer thickness transvaginally.  No focal lesion visualized.  Right ovary: 1.2 x 1.0 x 1.5 cm.  No ovarian or adnexal mass identified.  Left ovary: 1.7  x 0.7 x 1.4 cm.  No ovarian or adnexal mass identified.  Other Findings:  No free fluid  IMPRESSION:  1.  Stable small left posterior uterine fibroid measuring 1.7 cm. 2.  Normal ovaries.  No evidence of adnexal mass or free fluid.  Original Report Authenticated By: Danae Orleans, M.D.   US Pelvis Limited  03/27/2011  *RADIOLOGY REPORT*  Clinical Data: Left lower quadrant pain.  Post menopausal female.  TRANSABDOMINAL AND TRANSVAGINAL ULTRASOUND OF PELVIS  Technique:  Both transabdominal and transvaginal ultrasound examinations of the pelvis were performed.  Transabdominal technique was performed for global imaging of the pelvis including uterus, ovaries, adnexal regions, and pelvic cul-de-sac.  It was necessary to proceed with endovaginal exam following the transabdominal exam to visualize the endometrial stripe and ovaries.  Comparison:  01/12/2010  Findings: Uterus:  6.3 x 2.0 x 4.1 cm.  A small intramural fibroid is seen in the left posterior uterine body measuring 1.7 cm in maximum diameter.  No other fibroids are uterine masses are identified.  Endometrium: Measures 3 mm in double layer thickness transvaginally.  No focal lesion visualized.  Right ovary: 1.2 x 1.0 x 1.5 cm.  No ovarian or adnexal mass identified.  Left ovary: 1.7 x 0.7 x 1.4 cm.  No ovarian or adnexal mass identified.  Other Findings:  No free fluid  IMPRESSION:  1.  Stable small left posterior uterine fibroid measuring 1.7 cm. 2.  Normal ovaries.  No evidence of adnexal mass or free fluid.  Original Report Authenticated By: Danae Orleans, M.D.   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 IM   Assessment: Pelvic pain, small uterine fibroid                        Chronic low back pain  Plan: discussed with patient ultrasound results and need for PCP           Take ibuprofen for discomfort and follow up as planned.

## 2011-03-28 ENCOUNTER — Inpatient Hospital Stay (INDEPENDENT_AMBULATORY_CARE_PROVIDER_SITE_OTHER)
Admission: RE | Admit: 2011-03-28 | Discharge: 2011-03-28 | Disposition: A | Discharge status: Home / Self Care | Payer: Medicaid Other | Source: Ambulatory Visit | Attending: Emergency Medicine | Admitting: Emergency Medicine

## 2011-03-28 DIAGNOSIS — R112 Nausea with vomiting, unspecified: Secondary | ICD-10-CM

## 2011-03-28 DIAGNOSIS — R197 Diarrhea, unspecified: Secondary | ICD-10-CM

## 2011-04-20 ENCOUNTER — Ambulatory Visit (INDEPENDENT_AMBULATORY_CARE_PROVIDER_SITE_OTHER): Payer: Medicaid Other | Admitting: Family

## 2011-04-20 ENCOUNTER — Encounter: Payer: Self-pay | Admitting: Family Medicine

## 2011-04-20 VITALS — BP 123/82 | HR 85 | Temp 98.1°F | Ht 61.0 in | Wt 133.2 lb

## 2011-04-20 DIAGNOSIS — Z01419 Encounter for gynecological examination (general) (routine) without abnormal findings: Secondary | ICD-10-CM

## 2011-04-20 LAB — I-STAT 8, (EC8 V) (CONVERTED LAB)
BUN: 9
Bicarbonate: 27.3 — ABNORMAL HIGH
Chloride: 108
HCT: 39
Hemoglobin: 13.3
Operator id: 270651
Potassium: 4.1
Sodium: 140

## 2011-04-20 LAB — POCT I-STAT CREATININE: Creatinine, Ser: 0.8

## 2011-04-20 NOTE — Progress Notes (Signed)
  Subjective:    Melissa Jones is a 55 y.o. female who presents for an annual exam. The patient has no complaints today. The patient is sexually active. GYN screening history: last pap: was normal. The patient wears seatbelts: yes. The patient participates in regular exercise: no. Has the patient ever been transfused or tattooed?: not asked. The patient reports that there is not domestic violence in her life.   Menstrual History: OB History    Grav Para Term Preterm Abortions TAB SAB Ect Mult Living   5 4 4  0 1 1 0 0 0 4     No LMP recorded. Patient is postmenopausal.    The following portions of the patient's history were reviewed and updated as appropriate: allergies, past family history, past social history and past surgical history.  Review of Systems Pertinent items are noted in HPI.    Objective:    BP 123/82  Pulse 85  Temp(Src) 98.1 F (36.7 C) (Oral)  Ht 5\' 1"  (1.549 m)  Wt 133 lb 3.2 oz (60.419 kg)  BMI 25.17 kg/m2  General Appearance:    Alert, cooperative, no distress, appears stated age  Head:    Normocephalic, without obvious abnormality, atraumatic           Throat:   Lips, mucosa, and tongue normal; teeth and gums normal  Neck:   Supple, symmetrical, trachea midline, no adenopathy;    thyroid:  no enlargement/tenderness/nodules; no carotid   bruit or JVD  Back:     Symmetric, no curvature, ROM normal, no CVA tenderness  Lungs:     Clear to auscultation bilaterally, respirations unlabored  Chest Wall:    No tenderness or deformity   Heart:    Regular rate and rhythm, S1 and S2 normal, no murmur, rub   or gallop  Breast Exam:    No tenderness, masses, or nipple abnormality  Abdomen:     Soft, non-tender, bowel sounds active all four quadrants,    no masses, no organomegaly  Genitalia:    Normal female without lesion, discharge or tenderness  Rectal:    Normal tone, normal prostate, no masses or tenderness;   guaiac negative stool  Extremities:    Extremities normal, atraumatic, no cyanosis or edema  Pulses:   2+ and symmetric all extremities  Skin:   Skin color, texture, turgor normal, no rashes or lesions  Lymph nodes:   Cervical, supraclavicular, and axillary nodes normal  Neurologic:   CNII-XII intact, normal strength, sensation and reflexes    throughout  .    Assessment:    Healthy female exam.    Plan:     Await pap smear results. Follow up as needed. Sono-mammogram.  in one year; mammogram in August 2012 normal.

## 2011-04-21 ENCOUNTER — Other Ambulatory Visit (HOSPITAL_COMMUNITY)
Admission: RE | Admit: 2011-04-21 | Discharge: 2011-04-21 | Disposition: A | Discharge status: Home / Self Care | Payer: Medicaid Other | Source: Ambulatory Visit | Attending: Family Medicine | Admitting: Family Medicine

## 2011-04-21 DIAGNOSIS — Z01419 Encounter for gynecological examination (general) (routine) without abnormal findings: Secondary | ICD-10-CM | POA: Insufficient documentation

## 2011-04-21 NOTE — Progress Notes (Signed)
Addended by: Sherre Lain A on: 04/21/2011 11:49 AM   Modules accepted: Orders

## 2011-05-09 LAB — DIFFERENTIAL
Basophils Relative: 0
Eosinophils Absolute: 0.1
Monocytes Absolute: 0.5
Monocytes Relative: 8
Neutrophils Relative %: 48

## 2011-05-09 LAB — I-STAT 8, (EC8 V) (CONVERTED LAB)
Acid-Base Excess: 2
Chloride: 104
HCT: 40
Hemoglobin: 13.6
Operator id: 196461
Potassium: 4.3

## 2011-05-09 LAB — POCT I-STAT CREATININE: Creatinine, Ser: 0.9

## 2011-05-09 LAB — POCT CARDIAC MARKERS: Myoglobin, poc: 62.8

## 2011-05-09 LAB — CBC
MCHC: 33.8
MCV: 90.6
RBC: 4.1
RDW: 13.2

## 2011-05-15 LAB — CBC
Platelets: 251
WBC: 5.4

## 2011-05-15 LAB — POCT I-STAT CREATININE
Creatinine, Ser: 0.7
Operator id: 284251

## 2011-05-15 LAB — DIFFERENTIAL
Basophils Absolute: 0
Basophils Relative: 0
Eosinophils Absolute: 0.1
Eosinophils Relative: 3
Lymphocytes Relative: 45
Lymphs Abs: 2.5
Monocytes Absolute: 0.5
Monocytes Relative: 9
Neutro Abs: 2.3
Neutrophils Relative %: 43

## 2011-05-15 LAB — I-STAT 8, (EC8 V) (CONVERTED LAB)
Acid-Base Excess: 2
Potassium: 3.4 — ABNORMAL LOW
Sodium: 137
TCO2: 29
pH, Ven: 7.383 — ABNORMAL HIGH

## 2011-05-15 LAB — POCT CARDIAC MARKERS: Troponin i, poc: <0.05

## 2011-06-29 ENCOUNTER — Encounter (HOSPITAL_COMMUNITY): Payer: Self-pay | Admitting: Nurse Practitioner

## 2011-06-29 ENCOUNTER — Emergency Department (HOSPITAL_COMMUNITY)
Admission: EM | Admit: 2011-06-29 | Discharge: 2011-06-29 | Disposition: A | Discharge status: Home / Self Care | Payer: Medicaid Other | Attending: Emergency Medicine | Admitting: Emergency Medicine

## 2011-06-29 ENCOUNTER — Emergency Department (HOSPITAL_COMMUNITY): Payer: Medicaid Other

## 2011-06-29 DIAGNOSIS — F329 Major depressive disorder, single episode, unspecified: Secondary | ICD-10-CM | POA: Insufficient documentation

## 2011-06-29 DIAGNOSIS — F3289 Other specified depressive episodes: Secondary | ICD-10-CM | POA: Insufficient documentation

## 2011-06-29 DIAGNOSIS — G8929 Other chronic pain: Secondary | ICD-10-CM | POA: Insufficient documentation

## 2011-06-29 DIAGNOSIS — R220 Localized swelling, mass and lump, head: Secondary | ICD-10-CM | POA: Insufficient documentation

## 2011-06-29 DIAGNOSIS — M542 Cervicalgia: Secondary | ICD-10-CM | POA: Insufficient documentation

## 2011-06-29 DIAGNOSIS — R221 Localized swelling, mass and lump, neck: Secondary | ICD-10-CM | POA: Insufficient documentation

## 2011-06-29 DIAGNOSIS — I10 Essential (primary) hypertension: Secondary | ICD-10-CM | POA: Insufficient documentation

## 2011-06-29 DIAGNOSIS — R51 Headache: Secondary | ICD-10-CM | POA: Insufficient documentation

## 2011-06-29 MED ORDER — HYDROCODONE-ACETAMINOPHEN 5-325 MG PO TABS: 2.0000 | ORAL_TABLET | ORAL | Status: AC | PRN

## 2011-06-29 NOTE — ED Notes (Signed)
Pt states she noticed swelling to L side of face 1 week ago, and pain at this site increasingly worse since since onset. States the pain is intermittent, throbbing and radiates through her head "and down my whole body." States she has been unable to sleep d/t pain

## 2011-06-29 NOTE — ED Notes (Signed)
Patient transported to MRI 

## 2011-06-29 NOTE — ED Provider Notes (Signed)
Medical screening examination/treatment/procedure(s) were performed by non-physician practitioner and as supervising physician I was immediately available for consultation/collaboration.   Rosaline Ezekiel L Dearl Rudden, MD 06/29/11 1458 

## 2011-06-29 NOTE — ED Provider Notes (Signed)
History    this is a 55 year old female presenting to the ED with a chief complaint of left sided head and neck pain. She described pain as waxing waning, throbbing, and shooting and has been ongoing for the past week and a half. She also noticed swelling behind her left ear. She noticed increasing pain with head movement. She denies hearing changes or tinnitus in her left ear. She denies dental pain, or jaw pain. She felt as if the pain started from the base of her knee and radiate to the left side of the skull. She denies any rash or recent trauma. She has been seen in the ED and with her primary care doctor multiple times in the past for similar complaint. She states her doctor recommend for her to come to the ED for further evaluation. She denies fever, vision changes, sore throat, throat swelling, tongue swelling, chest pain, shortness of breath, nausea, or vomiting. She has a history of chronic back pain and had recently had a steroid shot about a week ago.  CSN: [id number] Arrival date & time: 06/29/2011  9:43 AM   First MD Initiated Contact with Patient 06/29/11 1016      Chief Complaint  Patient presents with  . Facial Swelling    (Consider location/radiation/quality/duration/timing/severity/associated sxs/prior treatment) HPI  Past Medical History  Diagnosis Date  . Hypertension   . Chronic back pain   . Depression     Takes Zoloft occas., no regular schedule  . Abnormal Pap smear of vagina     Past Surgical History  Procedure Date  . Tubal ligation   . Thyroid cyst excision   . Colposcopy     Family History  Problem Relation Age of Onset  . Sinusitis Mother   . Hypertension Mother   . Hypertension Father   . Depression Sister   . Depression Sister     History  Substance Use Topics  . Smoking status: Never Smoker   . Smokeless tobacco: Never Used  . Alcohol Use: No    OB History    Grav Para Term Preterm Abortions TAB SAB Ect Mult Living   5 4 4  0 1 1 0 0  0 4      Review of Systems  All other systems reviewed and are negative.    Allergies  Review of patient's allergies indicates no known allergies.  Home Medications   Current Outpatient Rx  Name Route Sig Dispense Refill  . ACETAMINOPHEN 500 MG PO TABS Oral Take 1,000 mg by mouth every 6 (six) hours as needed. pain    . CYCLOBENZAPRINE HCL 10 MG PO TABS Oral Take 10 mg by mouth 3 (three) times daily as needed. Muscle spasms     . LISINOPRIL 10 MG PO TABS Oral Take 10 mg by mouth daily.      . SERTRALINE HCL 100 MG PO TABS Oral Take 100 mg by mouth daily.       BP 131/98  Pulse 86  Temp(Src) 98 F (36.7 C) (Oral)  Resp 20  SpO2 97%  Physical Exam  Constitutional: She is oriented to person, place, and time. She appears well-developed and well-nourished. No distress.  HENT:  Head: Normocephalic and atraumatic.  Eyes: Conjunctivae are normal.  Neck: Normal range of motion. Neck supple. No tracheal deviation present. No thyromegaly present.    Cardiovascular: Normal rate and regular rhythm.  Exam reveals no gallop and no friction rub.   No murmur heard. Pulmonary/Chest: Effort normal. No stridor.  No respiratory distress. She has no wheezes.  Abdominal: Soft. Bowel sounds are normal. There is no tenderness.  Musculoskeletal: Normal range of motion.  Lymphadenopathy:    She has no cervical adenopathy.  Neurological: She is alert and oriented to person, place, and time.    ED Course  Procedures (including critical care time)  Labs Reviewed - No data to display No results found.   No diagnosis found.    MDM  Patient has had several visits in the past for similar complaint. CT of neck with soft tissues has performed twice, most recently in February, which shows no acute finding. She has never had any head imaging. I have discussed with my attending who has seen the patient and recommend obtaining a brain MRI without contrast for further evaluation. If the scan is  negative, patient will be referred to a neurologist, and a course of steroid burst and taper will be given.  1:40 PM MRI of the brain is negative today. I will offer the patient a referral to a neurologist, along with a short course of pain medication. She is also suggested to followup with the ENT Dr. for further evaluation. Patient agrees with plan.      Fayrene Helper, PA 06/29/11 1347

## 2011-10-03 ENCOUNTER — Emergency Department (HOSPITAL_COMMUNITY)
Admission: EM | Admit: 2011-10-03 | Discharge: 2011-10-03 | Disposition: A | Discharge status: Home / Self Care | Payer: Medicaid Other | Attending: Emergency Medicine | Admitting: Emergency Medicine

## 2011-10-03 ENCOUNTER — Other Ambulatory Visit: Payer: Self-pay

## 2011-10-03 ENCOUNTER — Encounter (HOSPITAL_COMMUNITY): Payer: Self-pay | Admitting: Emergency Medicine

## 2011-10-03 DIAGNOSIS — M549 Dorsalgia, unspecified: Secondary | ICD-10-CM | POA: Insufficient documentation

## 2011-10-03 DIAGNOSIS — I10 Essential (primary) hypertension: Secondary | ICD-10-CM | POA: Insufficient documentation

## 2011-10-03 DIAGNOSIS — G8929 Other chronic pain: Secondary | ICD-10-CM | POA: Insufficient documentation

## 2011-10-03 DIAGNOSIS — R002 Palpitations: Secondary | ICD-10-CM | POA: Insufficient documentation

## 2011-10-03 LAB — DIFFERENTIAL
Basophils Relative: 0 % (ref 0–1)
Lymphocytes Relative: 37 % (ref 12–46)
Lymphs Abs: 2.3 10*3/uL (ref 0.7–4.0)
Monocytes Absolute: 0.5 10*3/uL (ref 0.1–1.0)
Monocytes Relative: 8 % (ref 3–12)
Neutro Abs: 3.3 10*3/uL (ref 1.7–7.7)

## 2011-10-03 LAB — CBC
HCT: 37.3 % (ref 36.0–46.0)
Hemoglobin: 12.8 g/dL (ref 12.0–15.0)
MCHC: 34.3 g/dL (ref 30.0–36.0)
RBC: 4.3 MIL/uL (ref 3.87–5.11)

## 2011-10-03 LAB — POCT I-STAT TROPONIN I

## 2011-10-03 NOTE — ED Notes (Signed)
PT. REPORTS PALPITATIONS AND CHEST TIGHTNESS ONSET THIS EVENING  WITH SLIGHT SOB , DENIES NAUSEA OR DIAPHORESIS.

## 2011-10-03 NOTE — ED Provider Notes (Signed)
History     CSN: [id number]  Arrival date & time 10/03/11  0101   First MD Initiated Contact with Patient 10/03/11 0340      Chief Complaint  Patient presents with  . Palpitations    (Consider location/radiation/quality/duration/timing/severity/associated sxs/prior treatment) HPI This is a 56 year old Hispanic female who awoke this morning from sleep with a sensation that her heart was beating rapidly. This resolved when she sat up and only lasted a few seconds total. It was moderate to severe for a few seconds it lasted. It was associated with a vague abnormal sensation in the chest no frank pain. She denies dyspnea, diaphoresis or nausea with it. She has had similar episodes at night in the past. She is asymptomatic at present time, although she is very anxious and concerned that it will recur she goes back to sleep.  History was obtained with the assistance of an [employer] interpreter.  Past Medical History  Diagnosis Date  . Hypertension   . Chronic back pain   . Depression     Takes Zoloft occas., no regular schedule  . Abnormal Pap smear of vagina     Past Surgical History  Procedure Date  . Tubal ligation   . Thyroid cyst excision   . Colposcopy     Family History  Problem Relation Age of Onset  . Sinusitis Mother   . Hypertension Mother   . Hypertension Father   . Depression Sister   . Depression Sister     History  Substance Use Topics  . Smoking status: Never Smoker   . Smokeless tobacco: Never Used  . Alcohol Use: No    OB History    Grav Para Term Preterm Abortions TAB SAB Ect Mult Living   5 4 4  0 1 1 0 0 0 4      Review of Systems  All other systems reviewed and are negative.    Allergies  Review of patient's allergies indicates no known allergies.  Home Medications   Current Outpatient Rx  Name Route Sig Dispense Refill  . ACETAMINOPHEN 500 MG PO TABS Oral Take 1,000 mg by mouth every 6 (six) hours as needed. For pain    .  CYCLOBENZAPRINE HCL 10 MG PO TABS Oral Take 10 mg by mouth 3 (three) times daily as needed. Muscle spasms     . LISINOPRIL 10 MG PO TABS Oral Take 10 mg by mouth daily.        BP 140/88  Pulse 80  Temp(Src) 97.5 F (36.4 C) (Oral)  Resp 18  SpO2 99%  Physical Exam General: Well-developed, well-nourished female in no acute distress; appearance consistent with age of record HENT: normocephalic, atraumatic Eyes: pupils equal round and reactive to light; extraocular muscles intact Neck: supple Heart: regular rate and rhythm; no murmurs Lungs: clear to auscultation bilaterally Abdomen: soft; nondistended; nontender; no masses or hepatosplenomegaly; bowel sounds present Extremities: No deformity; full range of motion; pulses normal Neurologic: Awake, alert; motor function intact in all extremities and symmetric; no facial droop Skin: Warm and dry Psychiatric: Anxious    ED Course  Procedures (including critical care time)     MDM  EKG Interpretation:  Date & Time: 10/03/2011 1:10 AM  Rate: 92  Rhythm: normal sinus rhythm  QRS Axis: normal  Intervals: normal  ST/T Wave abnormalities: normal  Conduction Disutrbances:none  Narrative Interpretation:   Old EKG Reviewed: unchanged  Nursing notes and vitals signs, including pulse oximetry, reviewed.  Summary of this visit's  results, reviewed by myself:  Labs:  Results for orders placed during the hospital encounter of 10/03/11  CBC      Component Value Range   WBC 6.2  4.0 - 10.5 (K/uL)   RBC 4.30  3.87 - 5.11 (MIL/uL)   Hemoglobin 12.8  12.0 - 15.0 (g/dL)   HCT 78.2  95.6 - 21.3 (%)   MCV 86.7  78.0 - 100.0 (fL)   MCH 29.8  26.0 - 34.0 (pg)   MCHC 34.3  30.0 - 36.0 (g/dL)   RDW 08.6  57.8 - 46.9 (%)   Platelets 236  150 - 400 (K/uL)  DIFFERENTIAL      Component Value Range   Neutrophils Relative 54  43 - 77 (%)   Neutro Abs 3.3  1.7 - 7.7 (K/uL)   Lymphocytes Relative 37  12 - 46 (%)   Lymphs Abs 2.3  0.7 - 4.0  (K/uL)   Monocytes Relative 8  3 - 12 (%)   Monocytes Absolute 0.5  0.1 - 1.0 (K/uL)   Eosinophils Relative 2  0 - 5 (%)   Eosinophils Absolute 0.1  0.0 - 0.7 (K/uL)   Basophils Relative 0  0 - 1 (%)   Basophils Absolute 0.0  0.0 - 0.1 (K/uL)  POCT I-STAT TROPONIN I      Component Value Range   Troponin i, poc 0.00  0.00 - 0.08 (ng/mL)   Comment 3            5:48 AM Patient observed in ED. Patient noted to be in normal sinus rhythm even while sleeping. Will refer her to cardiology for further evaluation.            Hanley Seamen, MD 10/03/11 [phone]

## 2011-10-03 NOTE — ED Notes (Signed)
Pt here with palpitations that is new for her but has seen primary care md and was told she needs to see cardiologist.  Pt has had left lower back pain and shoots down left leg.  Pt sts after having steroid shot in back she has been having this burning in her feet that travels all the way up her body.  Pt sts that the palpitations wake her in her sleep and she has a mild pain and tylenol makes it go away.  Pt sts that she is scared to sleep and has not been getting sleep.  Flat affect.  Pt pain free and palpitation free now.  Pt is NSR 80 on the monitor

## 2011-10-03 NOTE — Discharge Instructions (Signed)
Palpitaciones  (Palpitations) Usted tiene palpitaciones. Es la sensacin de sentir que el latido cardaco es irregular o es ms rpido que lo normal. Aunque es algo que atemoriza, generalmente no es grave. Algunas de las causas de este trastorno son el fumar en exceso, en el consumo de cafena o de alcohol. Tambin pueden originarse por situaciones de estrs o de ansiedad. A veces la causa es una enfermedad cardaca. A menos que lo confirme de otro modo, el profesional que lo asiste no ha encontrado ahora signos de enfermedad grave. INSTRUCCIONES PARA EL CUIDADO DOMICILIARIO Para prevenir las palpitaciones:  Leamon Arnt, te y gaseosas sin cafena. Evite el chocolate.   Si fuma o bebe alcohol, deje el hbito o redzcalo lo ms que pueda.   Reduzca los niveles de estrs y [place]. La biorretroalimentacin, el yoga o la meditacin lo ayudarn a relajarse. Tambin podrn ayudarlo la natacin, trotar o caminar.  SOLICITE ATENCIN MDICA SI:  Sigue sintiendo el ritmo cardaco acelerado.   Las [employer] suceden con ms frecuencia.  SOLICITE ATENCIN MDICA DE INMEDIATO SI: Presenta dolor en el pecho, falta de aire, dolor de cabeza intenso, mareos o se desmaya. Document Released: 04/26/2005 Document Revised: 07/06/2011 [facility] Patient Information 2012 [place], [place].

## 2012-03-29 ENCOUNTER — Ambulatory Visit: Payer: Self-pay | Admitting: Obstetrics and Gynecology

## 2012-05-27 ENCOUNTER — Ambulatory Visit (INDEPENDENT_AMBULATORY_CARE_PROVIDER_SITE_OTHER): Payer: Self-pay | Admitting: Obstetrics & Gynecology

## 2012-05-27 ENCOUNTER — Encounter: Payer: Self-pay | Admitting: Obstetrics & Gynecology

## 2012-05-27 VITALS — BP 132/87 | HR 77 | Temp 98.0°F | Ht 61.0 in | Wt 138.1 lb

## 2012-05-27 DIAGNOSIS — Z01419 Encounter for gynecological examination (general) (routine) without abnormal findings: Secondary | ICD-10-CM

## 2012-05-27 DIAGNOSIS — Z1231 Encounter for screening mammogram for malignant neoplasm of breast: Secondary | ICD-10-CM

## 2012-05-27 DIAGNOSIS — Z0000 Encounter for general adult medical examination without abnormal findings: Secondary | ICD-10-CM

## 2012-05-27 NOTE — Progress Notes (Signed)
Subjective:    Melissa Jones is a 56 y.o. female who presents for an annual exam. The patient has no complaints today. The patient is not currently sexually active. GYN screening history: last pap: was normal. The patient wears seatbelts: yes. The patient participates in regular exercise: not asked. Has the patient ever been transfused or tattooed?: no. The patient reports that there is not domestic violence in her life.   Menstrual History: OB History    Grav Para Term Preterm Abortions TAB SAB Ect Mult Living   5 4 4  0 1 1 0 0 0 4      Menarche age: 53 No LMP recorded. Patient is postmenopausal.    The following portions of the patient's history were reviewed and updated as appropriate: allergies, current medications, past family history, past medical history, past social history, past surgical history and problem list.  Review of Systems A comprehensive review of systems was negative.    Objective:    BP 132/87  Pulse 77  Temp 98 F (36.7 C)  Ht 5\' 1"  (1.549 m)  Wt 138 lb 1.6 oz (62.642 kg)  BMI 26.09 kg/m2  General Appearance:    Alert, cooperative, no distress, appears stated age  Head:    Normocephalic, without obvious abnormality, atraumatic  Eyes:    PERRL, conjunctiva/corneas clear, EOM's intact, fundi    benign, both eyes  Ears:    Normal TM's and external ear canals, both ears  Nose:   Nares normal, septum midline, mucosa normal, no drainage    or sinus tenderness  Throat:   Lips, mucosa, and tongue normal; teeth and gums normal  Neck:   Supple, symmetrical, trachea midline, no adenopathy;    thyroid:  no enlargement/tenderness/nodules; no carotid   bruit or JVD  Back:     Symmetric, no curvature, ROM normal, no CVA tenderness  Lungs:     Clear to auscultation bilaterally, respirations unlabored  Chest Wall:    No tenderness or deformity   Heart:    Regular rate and rhythm, S1 and S2 normal, no murmur, rub   or gallop  Breast Exam:    No tenderness,  masses, or nipple abnormality  Abdomen:     Soft, non-tender, bowel sounds active all four quadrants,    no masses, no organomegaly  Genitalia:    Normal female without lesion, discharge or tenderness, mild atrophy, NSSA, NT, no adnexal masses     Extremities:   Extremities normal, atraumatic, no cyanosis or edema  Pulses:   2+ and symmetric all extremities  Skin:   Skin color, texture, turgor normal, no rashes or lesions  Lymph nodes:   Cervical, supraclavicular, and axillary nodes normal  Neurologic:   CNII-XII intact, normal strength, sensation and reflexes    throughout  .    Assessment:    Healthy female exam.    Plan:     Mammogram. Thin prep Pap smear.

## 2012-05-27 NOTE — Addendum Note (Signed)
Addended by: Allie Bossier on: 05/27/2012 04:02 PM   Modules accepted: Orders, Level of Service

## 2012-05-27 NOTE — Progress Notes (Deleted)
  Subjective:    Patient ID: Melissa Jones, female    DOB: 11/27/1955, 56 y.o.   MRN: [id number]  HPI  56 yo morbidly obese AA lady with a year's h/o DUB. A recent pap smear showed AGCUS, favor endometrial origin.  Review of Systems     Objective:   Physical Exam Colposcopy adequate, normal findings Endometrial biopsy done with Pipelle and 3 passes, large amount of obtained She tolerated the procedure well.      Assessment & Plan:  DUB, AGCUS- await ECC and EMBX

## 2012-05-29 ENCOUNTER — Ambulatory Visit (HOSPITAL_COMMUNITY)
Admission: RE | Admit: 2012-05-29 | Discharge: 2012-05-29 | Disposition: A | Discharge status: Home / Self Care | Payer: Medicare Other | Source: Ambulatory Visit | Attending: Obstetrics & Gynecology | Admitting: Obstetrics & Gynecology

## 2012-05-29 DIAGNOSIS — Z1231 Encounter for screening mammogram for malignant neoplasm of breast: Secondary | ICD-10-CM | POA: Insufficient documentation

## 2013-11-13 ENCOUNTER — Ambulatory Visit: Payer: Medicare Other | Admitting: Nurse Practitioner

## 2014-06-01 ENCOUNTER — Encounter: Payer: Self-pay | Admitting: Obstetrics & Gynecology

## 2014-06-19 ENCOUNTER — Other Ambulatory Visit (HOSPITAL_COMMUNITY): Payer: Self-pay | Admitting: Internal Medicine

## 2014-06-19 DIAGNOSIS — Z1231 Encounter for screening mammogram for malignant neoplasm of breast: Secondary | ICD-10-CM

## 2014-06-30 ENCOUNTER — Other Ambulatory Visit (HOSPITAL_COMMUNITY): Payer: Self-pay | Admitting: Internal Medicine

## 2014-06-30 DIAGNOSIS — Z1231 Encounter for screening mammogram for malignant neoplasm of breast: Secondary | ICD-10-CM

## 2014-07-02 ENCOUNTER — Ambulatory Visit (HOSPITAL_COMMUNITY)
Admission: RE | Admit: 2014-07-02 | Discharge: 2014-07-02 | Disposition: A | Discharge status: Home / Self Care | Payer: Medicare (Managed Care) | Source: Ambulatory Visit | Attending: Internal Medicine | Admitting: Internal Medicine

## 2014-07-02 DIAGNOSIS — Z1231 Encounter for screening mammogram for malignant neoplasm of breast: Secondary | ICD-10-CM | POA: Insufficient documentation

## 2014-08-07 ENCOUNTER — Other Ambulatory Visit (HOSPITAL_COMMUNITY)
Admission: RE | Admit: 2014-08-07 | Discharge: 2014-08-07 | Disposition: A | Discharge status: Home / Self Care | Payer: Medicare (Managed Care) | Source: Ambulatory Visit | Attending: Obstetrics & Gynecology | Admitting: Obstetrics & Gynecology

## 2014-08-07 ENCOUNTER — Ambulatory Visit (INDEPENDENT_AMBULATORY_CARE_PROVIDER_SITE_OTHER): Payer: Medicare (Managed Care) | Admitting: Obstetrics & Gynecology

## 2014-08-07 ENCOUNTER — Encounter: Payer: Self-pay | Admitting: Obstetrics & Gynecology

## 2014-08-07 VITALS — BP 119/79 | HR 79 | Ht 63.0 in | Wt 130.3 lb

## 2014-08-07 DIAGNOSIS — Z01419 Encounter for gynecological examination (general) (routine) without abnormal findings: Secondary | ICD-10-CM

## 2014-08-07 DIAGNOSIS — Z1151 Encounter for screening for human papillomavirus (HPV): Secondary | ICD-10-CM | POA: Insufficient documentation

## 2014-08-07 DIAGNOSIS — Z124 Encounter for screening for malignant neoplasm of cervix: Secondary | ICD-10-CM | POA: Insufficient documentation

## 2014-08-07 DIAGNOSIS — Z0000 Encounter for general adult medical examination without abnormal findings: Secondary | ICD-10-CM

## 2014-08-07 NOTE — Progress Notes (Signed)
Subjective:    Melissa Jones is a 59 y.o. [place] P4 ([place] female who presents for an annual exam. The patient has no complaints today. The patient is not currently sexually active as her husband is still in [place]. She is uncertain when he will be here. GYN screening history: last pap: was normal. The patient wears seatbelts: yes. The patient participates in regular exercise: no. Has the patient ever been transfused or tattooed?: no. The patient reports that there is not domestic violence in her life.   Menstrual History: OB History    Gravida Para Term Preterm AB TAB SAB Ectopic Multiple Living   5 4 4  0 1 1 0 0 0 4      No LMP recorded. Patient is postmenopausal.    The following portions of the patient's history were reviewed and updated as appropriate: allergies, current medications, past family history, past medical history, past social history, past surgical history and problem list.  Review of Systems A comprehensive review of systems was negative. She had a mammogram reportedly normal recently.   Objective:    BP 119/79 mmHg  Pulse 79  Ht 5\' 3"  (1.6 m)  Wt 130 lb 4.8 oz (59.104 kg)  BMI 23.09 kg/m2  General Appearance:    Alert, cooperative, no distress, appears stated age  Head:    Normocephalic, without obvious abnormality, atraumatic  Eyes:    PERRL, conjunctiva/corneas clear, EOM's intact, fundi    benign, both eyes  Ears:    Normal TM's and external ear canals, both ears  Nose:   Nares normal, septum midline, mucosa normal, no drainage    or sinus tenderness  Throat:   Lips, mucosa, and tongue normal; teeth and gums normal  Neck:   Supple, symmetrical, trachea midline, no adenopathy;    thyroid:  no enlargement/tenderness/nodules; no carotid   bruit or JVD  Back:     Symmetric, no curvature, ROM normal, no CVA tenderness  Lungs:     Clear to auscultation bilaterally, respirations unlabored  Chest Wall:    No tenderness or deformity   Heart:     Regular rate and rhythm, S1 and S2 normal, no murmur, rub   or gallop  Breast Exam:    No tenderness, masses, or nipple abnormality  Abdomen:     Soft, non-tender, bowel sounds active all four quadrants,    no masses, no organomegaly  Genitalia:    Normal female without lesion, discharge or tenderness, NSSA, NT, mobile, no adnexal masses palpated     Extremities:   Extremities normal, atraumatic, no cyanosis or edema  Pulses:   2+ and symmetric all extremities  Skin:   Skin color, texture, turgor normal, no rashes or lesions  Lymph nodes:   Cervical, supraclavicular, and axillary nodes normal  Neurologic:   CNII-XII intact, normal strength, sensation and reflexes    throughout  .    Assessment:    Healthy female exam.    Plan:     Breast self exam technique reviewed and patient encouraged to perform self-exam monthly. Thin prep Pap smear. with cotesting

## 2014-08-11 LAB — CYTOLOGY - PAP

## 2016-05-11 ENCOUNTER — Inpatient Hospital Stay (HOSPITAL_COMMUNITY)
Admission: AD | Admit: 2016-05-11 | Discharge: 2016-05-11 | Disposition: A | Discharge status: Home / Self Care | Payer: Medicare (Managed Care) | Source: Ambulatory Visit | Attending: Obstetrics & Gynecology | Admitting: Obstetrics & Gynecology

## 2016-05-11 ENCOUNTER — Encounter (HOSPITAL_COMMUNITY): Payer: Self-pay

## 2016-05-11 DIAGNOSIS — K640 First degree hemorrhoids: Secondary | ICD-10-CM | POA: Insufficient documentation

## 2016-05-11 DIAGNOSIS — K6289 Other specified diseases of anus and rectum: Secondary | ICD-10-CM

## 2016-05-11 DIAGNOSIS — I10 Essential (primary) hypertension: Secondary | ICD-10-CM | POA: Insufficient documentation

## 2016-05-11 DIAGNOSIS — F329 Major depressive disorder, single episode, unspecified: Secondary | ICD-10-CM | POA: Insufficient documentation

## 2016-05-11 LAB — URINALYSIS, ROUTINE W REFLEX MICROSCOPIC
Bilirubin Urine: NEGATIVE
GLUCOSE, UA: NEGATIVE mg/dL
HGB URINE DIPSTICK: NEGATIVE
Ketones, ur: NEGATIVE mg/dL
Leukocytes, UA: NEGATIVE
Nitrite: NEGATIVE
PH: 7 (ref 5.0–8.0)
PROTEIN: NEGATIVE mg/dL

## 2016-05-11 MED ORDER — HYDROCORTISONE 2.5 % RE CREA: 1.0000 "application " | TOPICAL_CREAM | Freq: Two times a day (BID) | RECTAL | 0 refills | Status: DC

## 2016-05-11 MED ORDER — TRAMADOL HCL 50 MG PO TABS: 50.0000 mg | ORAL_TABLET | Freq: Four times a day (QID) | ORAL | 0 refills | Status: AC | PRN

## 2016-05-11 NOTE — Discharge Instructions (Signed)
Hemorroides  (Hemorrhoids) Las hemorroides son venas inflamadas alrededor del recto o del ano. Hay dos tipos de hemorroides:   Hemorroides internas. Se forman en las venas del interior del recto. Pueden abultarse hacia el exterior e irritarse y Secretary/administrator.  Hemorroides externas. Se producen en las venas externas al ano y pueden sentirse como un bulto o zona hinchada dura y dolorosa cerca del ano. CAUSAS   Embarazo.   Obesidad.   Constipacin o diarrea.   Dificultad para mover el intestino.   Permanecer sentado durante largos perodos en el inodoro.  Levantar objetos pesados u otras actividades que impliquen esfuerzo.  Sexo anal. SNTOMAS   Dolor.   Picazn o irritacin anal.   Sangrado rectal.   Prdida fecal.   Inflamacin anal.   Uno o ms bultos en la zona anal.  DIAGNSTICO  El mdico puede diagnosticar las hemorroides mediante un examen visual. Otros estudios o anlisis que se pueden realizar son:   Examen de la zona rectal con Ardelia Mems mano enguantada (examen digital rectal).   Examen del canal anal utilizando un pequeo tubo (endoscopio).   Anlisis de sangre si ha perdido [place] cantidad significativa de [place].  Un estudio para observar el interior del colon (sigmoideoscopa o colonoscopa). TRATAMIENTO  La mayora de las hemorroides pueden tratarse en casa. Sin embargo, si los sntomas no mejoran o tiene Nurse, children's, el mdico puede Optometrist un procedimiento para disminuir las hemorroides o extirparlas completamente. Los tratamientos posibles son:   Colocacin de una banda de goma en la base de la hemorroide para cortar la circulacin (ligadura con Forensic psychologist).   Inyeccin de una sustancia qumica para Neurosurgeon las hemorroides (escleroterapia).   Utilizacin de un instrumento para quemar las hemorroides (terapia con luz infrarroja).   Extirpacin quirrgica de la hemorroides (hemorroidectoma).   Colocacin de grapas en la hemorroides para  bloquear el flujo de sangre a los tejidos (engrapado de hemorroides).  INSTRUCCIONES PARA EL CUIDADO EN EL HOGAR   Consuma alimentos con fibra, como cereales integrales, legumbres, frutos secos, frutas y verduras. Pregntele a su mdico acerca de tomar productos con fibra aadida en ellos (suplementos defibra).  Aumente la ingestin de lquidos. Beba gran cantidad de lquido para mantener la orina de tono claro o color amarillo plido.   Haga ejercicios regularmente.   Vaya al bao cuando sienta la necesidad de mover el intestino. No espere.   Evite hacer fuerza al mover el intestino.   Mantenga la zona anal limpia y seca. Use papel higinico hmedo o toallitas humedecidas despus de mover el intestino.   Puede usar o Midwife segn las indicaciones algunas cremas especiales y supositorios.   Tome slo medicamentos de venta libre o recetados, segn las indicaciones del mdico.   Tome baos de asiento tibios durante 15-20 minutos, 3-4 veces por da para Glass blower/designer y las [place].   Coloque una bolsa de hielo sobre las hemorroides si le duelen o se hinchan. Usar compresas de [employer] baos de asiento puede ser [place].   Ponga el hielo en una bolsa plstica.   Colquese una toalla entre la piel y la bolsa de hielo.   Deje el hielo durante 15 - 20 minutos y aplquelo 3 - 4 veces por Training and development officer.   No utilice una almohada en forma de aro ni se siente en el inodoro durante perodos prolongados. Esto aumenta la afluencia de sangre y Conservation officer, historic buildings.  SOLICITE ATENCIN MDICA SI:   Aumenta el dolor y la hinchazn y no  puede controlarlo con la medicacin o con un tratamiento.  Tiene un sangrado que no IT consultant.  No puede mover el intestino.  Siente dolor o tiene inflamacin fuera de la zona de las hemorroides. ASEGRESE DE QUE:   Comprende estas instrucciones.  Controlar su enfermedad.  Recibir ayuda de inmediato si no mejora o si empeora.   Esta  informacin no tiene Marine scientist el consejo del mdico. Asegrese de hacerle al mdico cualquier pregunta que tenga.   Document Released: 07/17/2005 Document Revised: 03/19/2013 Elsevier Interactive Patient Education [employer].

## 2016-05-11 NOTE — MAU Provider Note (Signed)
History     CSN: [id number]  Arrival date and time: 05/11/16 1056   None     Chief Complaint  Patient presents with  . rectal pressure   Postmenopausal female presents with pain and burning of rectum x2 months. She also reports burning of bilateral hands. The pain is interrupting her sleep. She was seen in [place] and was told it could be a virus or hemorrhoids and given Neurontin, hydrocortisone cream, and Ultram which have not helped. She also reports dysuria and urinary frequency x2 months. She does not have a PCP.    Past Medical History:  Diagnosis Date  . Abnormal Pap smear of vagina   . Chronic back pain   . Depression    Takes Zoloft occas., no regular schedule  . Hyperlipidemia   . Hypertension     Past Surgical History:  Procedure Laterality Date  . COLPOSCOPY    . THYROID CYST EXCISION    . TUBAL LIGATION      Family History  Problem Relation Age of Onset  . Sinusitis Mother   . Hypertension Mother   . Hypertension Father   . Depression Sister   . Depression Sister     Social History  Substance Use Topics  . Smoking status: Never Smoker  . Smokeless tobacco: Never Used  . Alcohol use No    Allergies:  Allergies  Allergen Reactions  . Iodine Itching  . Morphine And Related Nausea Only    Prescriptions Prior to Admission  Medication Sig Dispense Refill Last Dose  . atorvastatin (LIPITOR) 40 MG tablet Take 40 mg by mouth daily.   05/10/2016 at Unknown time  . cyclobenzaprine (FLEXERIL) 10 MG tablet Take 10 mg by mouth 3 (three) times daily as needed. Muscle spasms    05/10/2016 at Unknown time  . gabapentin (NEURONTIN) 300 MG capsule Take 300 mg by mouth 3 (three) times daily.   05/10/2016 at Unknown time  . hydrocortisone 2.5 % cream Apply topically 2 (two) times daily.   05/10/2016 at Unknown time  . lisinopril (PRINIVIL,ZESTRIL) 10 MG tablet Take 20 mg by mouth daily.    05/10/2016 at Unknown time  . loratadine (CLARITIN) 10 MG tablet  Take 10 mg by mouth daily.   05/10/2016 at Unknown time  . ranitidine (ZANTAC) 300 MG tablet Take 300 mg by mouth at bedtime.   05/11/2016 at Unknown time  . sertraline (ZOLOFT) 25 MG tablet Take 25 mg by mouth daily.   05/10/2016 at Unknown time  . traMADol (ULTRAM) 50 MG tablet Take 50 mg by mouth every 6 (six) hours as needed.   05/10/2016 at Unknown time  . acetaminophen (TYLENOL) 500 MG tablet Take 1,000 mg by mouth every 6 (six) hours as needed. For pain   Not Taking    Review of Systems  Constitutional: Negative.   Gastrointestinal: Positive for diarrhea (intermittent). Negative for constipation.  Genitourinary: Positive for dysuria and frequency.  Neurological: Positive for sensory change (hands).   Physical Exam   Blood pressure 151/89, pulse 85, temperature 98 F (36.7 C), temperature source Oral, resp. rate 18.  Physical Exam  Constitutional: She is oriented to person, place, and time. She appears well-developed and well-nourished.  HENT:  Head: Normocephalic and atraumatic.  Neck: Normal range of motion.  Cardiovascular: Normal rate.   Respiratory: Effort normal.  Genitourinary: Rectal exam shows external hemorrhoid (several small tags and 1 small non-thrombosed hemorrhoid ). Rectal exam shows no internal hemorrhoid, no fissure, no mass,  no tenderness, anal tone normal and guaiac negative stool.  Musculoskeletal: Normal range of motion.  Neurological: She is alert and oriented to person, place, and time.  Skin: Skin is warm and dry.  Psychiatric: She has a normal mood and affect.   Results for orders placed or performed during the hospital encounter of 05/11/16 (from the past 24 hour(s))  Urinalysis, Routine w reflex microscopic (not at [facility])     Status: Abnormal   Collection Time: 05/11/16 11:00 AM  Result Value Ref Range   Color, Urine STRAW (A) YELLOW   APPearance CLEAR CLEAR   Specific Gravity, Urine <1.005 (L) 1.005 - 1.030   pH 7.0 5.0 - 8.0   Glucose, UA  NEGATIVE NEGATIVE mg/dL   Hgb urine dipstick NEGATIVE NEGATIVE   Bilirubin Urine NEGATIVE NEGATIVE   Ketones, ur NEGATIVE NEGATIVE mg/dL   Protein, ur NEGATIVE NEGATIVE mg/dL   Nitrite NEGATIVE NEGATIVE   Leukocytes, UA NEGATIVE NEGATIVE    MAU Course  Procedures  MDM Labs ordered and reviewed. Discussed presentation and clinical findings with Dr. Ihor Dow. Will treat with Anusol-HC and Ultram. Will need f/u with PCP. Stable for discharge home.  Assessment and Plan   1. Rectal pain   2. Grade I hemorrhoids    Discharge home Follow up with PCP-message sent to pool for referral    Medication List    TAKE these medications   acetaminophen 500 MG tablet Commonly known as:  TYLENOL Take 1,000 mg by mouth every 6 (six) hours as needed. For pain   atorvastatin 40 MG tablet Commonly known as:  LIPITOR Take 40 mg by mouth daily.   cyclobenzaprine 10 MG tablet Commonly known as:  FLEXERIL Take 10 mg by mouth 3 (three) times daily as needed. Muscle spasms   gabapentin 300 MG capsule Commonly known as:  NEURONTIN Take 300 mg by mouth 3 (three) times daily.   hydrocortisone 2.5 % cream Apply topically 2 (two) times daily.   hydrocortisone 2.5 % rectal cream Commonly known as:  ANUSOL-HC Place 1 application rectally 2 (two) times daily.   lisinopril 10 MG tablet Commonly known as:  PRINIVIL,ZESTRIL Take 20 mg by mouth daily.   loratadine 10 MG tablet Commonly known as:  CLARITIN Take 10 mg by mouth daily.   ranitidine 300 MG tablet Commonly known as:  ZANTAC Take 300 mg by mouth at bedtime.   sertraline 25 MG tablet Commonly known as:  ZOLOFT Take 25 mg by mouth daily.   traMADol 50 MG tablet Commonly known as:  ULTRAM Take 1 tablet (50 mg total) by mouth every 6 (six) hours as needed. What changed:  You were already taking a medication with the same name, and this prescription was added. Make sure you understand how and when to take each.   traMADol 50  MG tablet Commonly known as:  ULTRAM Take 50 mg by mouth every 6 (six) hours as needed. What changed:  Another medication with the same name was added. Make sure you understand how and when to take each.       Julianne Handler, CNM 05/11/2016, 12:27 PM

## 2016-05-11 NOTE — MAU Note (Signed)
Pt presents to MAU with rectal pain, has been going on for 2 months. Reports Dr. In [place] told her that it was hemorrhoids or a virus, Dr. Rockey Situ her to come here because he believed she would need surgery. Prescribed oral meds and suppository.  Describes a burning type pain and chills.

## 2016-05-15 ENCOUNTER — Telehealth: Payer: Self-pay | Admitting: General Practice

## 2016-05-15 NOTE — Telephone Encounter (Signed)
Per Julianne Handler, patient needs referral to PCP. Called patient with pacific interpreter [phone] & provided her with information for [facility]. Patient verbalized understanding to all & had no questions

## 2016-05-20 DIAGNOSIS — I10 Essential (primary) hypertension: Secondary | ICD-10-CM | POA: Insufficient documentation

## 2016-05-20 DIAGNOSIS — M79641 Pain in right hand: Secondary | ICD-10-CM | POA: Diagnosis not present

## 2016-05-20 DIAGNOSIS — M79642 Pain in left hand: Secondary | ICD-10-CM | POA: Diagnosis not present

## 2016-05-21 ENCOUNTER — Emergency Department (HOSPITAL_COMMUNITY)
Admission: EM | Admit: 2016-05-21 | Discharge: 2016-05-21 | Disposition: A | Discharge status: Home / Self Care | Payer: Medicare (Managed Care) | Attending: Emergency Medicine | Admitting: Emergency Medicine

## 2016-05-21 ENCOUNTER — Encounter (HOSPITAL_COMMUNITY): Payer: Self-pay | Admitting: Emergency Medicine

## 2016-05-21 DIAGNOSIS — M79601 Pain in right arm: Secondary | ICD-10-CM

## 2016-05-21 DIAGNOSIS — M79602 Pain in left arm: Secondary | ICD-10-CM

## 2016-05-21 LAB — CBC WITH DIFFERENTIAL/PLATELET
BASOS PCT: 0 %
Basophils Absolute: 0 10*3/uL (ref 0.0–0.1)
Eosinophils Absolute: 0.1 10*3/uL (ref 0.0–0.7)
Eosinophils Relative: 1 %
HEMATOCRIT: 39 % (ref 36.0–46.0)
Hemoglobin: 13 g/dL (ref 12.0–15.0)
Lymphocytes Relative: 46 %
Lymphs Abs: 2.5 10*3/uL (ref 0.7–4.0)
MCH: 29.6 pg (ref 26.0–34.0)
MCHC: 33.3 g/dL (ref 30.0–36.0)
MCV: 88.8 fL (ref 78.0–100.0)
MONO ABS: 0.4 10*3/uL (ref 0.1–1.0)
MONOS PCT: 7 %
NEUTROS ABS: 2.6 10*3/uL (ref 1.7–7.7)
Neutrophils Relative %: 46 %
Platelets: 235 10*3/uL (ref 150–400)
RBC: 4.39 MIL/uL (ref 3.87–5.11)
RDW: 14 % (ref 11.5–15.5)
WBC: 5.6 10*3/uL (ref 4.0–10.5)

## 2016-05-21 LAB — BASIC METABOLIC PANEL
Anion gap: 8 (ref 5–15)
BUN: 10 mg/dL (ref 6–20)
CALCIUM: 9.6 mg/dL (ref 8.9–10.3)
CO2: 28 mmol/L (ref 22–32)
CREATININE: 0.79 mg/dL (ref 0.44–1.00)
Chloride: 104 mmol/L (ref 101–111)
GFR calc non Af Amer: >60 mL/min (ref >60)
GLUCOSE: 97 mg/dL (ref 65–99)
Potassium: 3.8 mmol/L (ref 3.5–5.1)
Sodium: 140 mmol/L (ref 135–145)

## 2016-05-21 LAB — I-STAT TROPONIN, ED: Troponin i, poc: 0 ng/mL (ref 0.00–0.08)

## 2016-05-21 MED ORDER — IBUPROFEN 400 MG PO TABS
600.0000 mg | ORAL_TABLET | Freq: Once | ORAL | Status: AC
  Administered 2016-05-21: 400 mg via ORAL
  Filled 2016-05-21: qty 1

## 2016-05-21 MED ORDER — GABAPENTIN 300 MG PO CAPS: 300.0000 mg | ORAL_CAPSULE | Freq: Two times a day (BID) | ORAL | 0 refills | Status: AC

## 2016-05-21 MED ORDER — GABAPENTIN 300 MG PO CAPS
300.0000 mg | ORAL_CAPSULE | Freq: Once | ORAL | Status: AC
  Administered 2016-05-21: 300 mg via ORAL
  Filled 2016-05-21: qty 1

## 2016-05-21 MED ORDER — NAPROXEN 500 MG PO TABS: 500.0000 mg | ORAL_TABLET | Freq: Two times a day (BID) | ORAL | 0 refills | Status: DC

## 2016-05-21 MED ORDER — GABAPENTIN 600 MG PO TABS
300.0000 mg | ORAL_TABLET | Freq: Once | ORAL | Status: DC
  Filled 2016-05-21: qty 0.5
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
## 2016-05-21 NOTE — ED Provider Notes (Signed)
[place] DEPT Provider Note   CSN: [id number] Arrival date & time: 05/20/16  2350  By signing my name below, I, Emmanuella Mensah, attest that this documentation has been prepared under the direction and in the presence of Merryl Hacker, MD. Electronically Signed: Judithann Sauger, ED Scribe. 05/21/16. 1:52 AM.   History   Chief Complaint Chief Complaint  Patient presents with  . Pain   HPI Comments: Melissa Jones is a 60 y.o. female with a hx of chronic back pain, hypertension, HLD who presents to the Emergency Department complaining of gradually worsening moderate burning sensation to BUE onset September 2017. She reports associated burning to her left shoulder onset 3 days ago. She explains that she is from [place] and she received an MRI there in September 2017 prior to onset of her symptoms. She reports that she had a rash in [place] but that has since resolved. She states that she has been evaluated for these symptoms and was given Benadryl, Meloxicam, and another medicine but her symptoms are persisting. No other alleviating factors noted. Pt has an allergy to Iodine and Morphine. She denies any fever, lip/tongue swelling, trouble swallowing, generalized rash, or any other symptoms.   The history is provided by the patient. A language interpreter was used ([place]).    Past Medical History:  Diagnosis Date  . Abnormal Pap smear of vagina   . Chronic back pain   . Depression    Takes Zoloft occas., no regular schedule  . Hyperlipidemia   . Hypertension     Patient Active Problem List   Diagnosis Date Noted  . BACK PAIN 10/11/2010  . CARPAL TUNNEL SYNDROME, BILATERAL 10/08/2009  . SHOULDER PAIN, LEFT 02/18/2009  . ABDOMINAL PAIN, LEFT LOWER QUADRANT 11/04/2008  . DEPRESSION, ACUTE, RECURRENT 08/16/2008  . MYALGIA 08/04/2008  . PRIMARY HYPERPARATHYROIDISM 04/14/2008  . HYPERCALCEMIA 03/18/2008  . Lumbago 03/18/2008  . HEADACHE, REBOUND  12/04/2007  . PAP SMEAR, ABNORMAL, ASCUS 08/09/2007  . HYPERTENSION 07/23/2007  . CHEST PAIN, ATYPICAL 07/23/2007  . HYPERLIPIDEMIA 07/19/2007  . ESSENTIAL HYPERTENSION, BENIGN 07/19/2007  . ASCUS (atypical squamous cells of undetermined significance) on Pap smear 07/16/2007    Past Surgical History:  Procedure Laterality Date  . COLPOSCOPY    . THYROID CYST EXCISION    . TUBAL LIGATION      OB History    Gravida Para Term Preterm AB Living   5 4 4  0 1 4   SAB TAB Ectopic Multiple Live Births   0 1 0 0         Home Medications    Prior to Admission medications   Medication Sig Start Date End Date Taking? Authorizing Provider  acetaminophen (TYLENOL) 500 MG tablet Take 1,000 mg by mouth every 6 (six) hours as needed. For pain    Historical Provider, MD  atorvastatin (LIPITOR) 40 MG tablet Take 40 mg by mouth daily.    Historical Provider, MD  cyclobenzaprine (FLEXERIL) 10 MG tablet Take 10 mg by mouth 3 (three) times daily as needed. Muscle spasms     Historical Provider, MD  gabapentin (NEURONTIN) 300 MG capsule Take 300 mg by mouth 3 (three) times daily.    Historical Provider, MD  gabapentin (NEURONTIN) 300 MG capsule Take 1 capsule (300 mg total) by mouth 2 (two) times daily. 05/21/16   Merryl Hacker, MD  hydrocortisone (ANUSOL-HC) 2.5 % rectal cream Place 1 application rectally 2 (two) times daily. 05/11/16   Julianne Handler, CNM  hydrocortisone 2.5 % cream Apply topically 2 (two) times daily.    Historical Provider, MD  lisinopril (PRINIVIL,ZESTRIL) 10 MG tablet Take 20 mg by mouth daily.     Historical Provider, MD  loratadine (CLARITIN) 10 MG tablet Take 10 mg by mouth daily.    Historical Provider, MD  naproxen (NAPROSYN) 500 MG tablet Take 1 tablet (500 mg total) by mouth 2 (two) times daily. 05/21/16   Merryl Hacker, MD  ranitidine (ZANTAC) 300 MG tablet Take 300 mg by mouth at bedtime.    Historical Provider, MD  sertraline (ZOLOFT) 25 MG tablet Take 25 mg  by mouth daily.    Historical Provider, MD  traMADol (ULTRAM) 50 MG tablet Take 50 mg by mouth every 6 (six) hours as needed.    Historical Provider, MD  traMADol (ULTRAM) 50 MG tablet Take 1 tablet (50 mg total) by mouth every 6 (six) hours as needed. 05/11/16 05/11/17  Julianne Handler, CNM    Family History Family History  Problem Relation Age of Onset  . Sinusitis Mother   . Hypertension Mother   . Hypertension Father   . Depression Sister   . Depression Sister     Social History Social History  Substance Use Topics  . Smoking status: Never Smoker  . Smokeless tobacco: Never Used  . Alcohol use No     Allergies   Iodine and Morphine and related   Review of Systems Review of Systems  Constitutional: Negative for fever.  HENT: Negative for facial swelling and trouble swallowing.   Respiratory: Negative for shortness of breath.   Cardiovascular: Negative for chest pain.  Skin: Negative for rash.       Burning sensation to BUE  All other systems reviewed and are negative.    Physical Exam Updated Vital Signs BP 135/64 (BP Location: Right Arm)   Pulse 82   Temp 97.7 F (36.5 C) (Oral)   Resp 18   Ht 5\' 2"  (1.575 m)   Wt 134 lb 3 oz (60.9 kg)   SpO2 99%   BMI 24.54 kg/m   Physical Exam  Constitutional: She is oriented to person, place, and time. She appears well-developed and well-nourished. No distress.  HENT:  Head: Normocephalic and atraumatic.  Cardiovascular: Normal rate, regular rhythm and normal heart sounds.   Pulmonary/Chest: Effort normal and breath sounds normal. No respiratory distress. She has no wheezes.  Abdominal: Soft. Bowel sounds are normal. There is no tenderness.  Musculoskeletal:  5 out of 5 strength bilateral upper extremities, no overlying skin changes, no tenderness to palpation, no obvious deformities, 2+ radial pulse  Neurological: She is alert and oriented to person, place, and time.  Skin: Skin is warm and dry.  Psychiatric:  She has a normal mood and affect.  Nursing note and vitals reviewed.    ED Treatments / Results  DIAGNOSTIC STUDIES: Oxygen Saturation is 100% on RA, normal by my interpretation.    COORDINATION OF CARE: 1:51 AM- Pt advised of plan for treatment and pt agrees. Pt will receive lab work for further evaluation.    Labs (all labs ordered are listed, but only abnormal results are displayed) Labs Reviewed  CBC WITH DIFFERENTIAL/PLATELET  BASIC METABOLIC PANEL  Randolm Idol, ED    EKG  EKG Interpretation  Date/Time:  Sunday May 21 2016 03:04:39 EDT Ventricular Rate:  77 PR Interval:    QRS Duration: 95 QT Interval:  378 QTC Calculation: 428 R Axis:   47 Text Interpretation:  Sinus rhythm Confirmed by Dina Rich  MD, Loma Sousa ([id number]) on 05/21/2016 3:07:05 AM       Radiology No results found.  Procedures Procedures (including critical care time)  Medications Ordered in ED Medications  ibuprofen (ADVIL,MOTRIN) tablet 600 mg (400 mg Oral Given 05/21/16 0251)  gabapentin (NEURONTIN) capsule 300 mg (300 mg Oral Given 05/21/16 0251)     Initial Impression / Assessment and Plan / ED Course  Merryl Hacker, MD has reviewed the triage vital signs and the nursing notes.  Pertinent labs & imaging results that were available during my care of the patient were reviewed by me and considered in my medical decision making (see chart for details).  Clinical Course    Patient presents with persistent burning of the bilateral upper extremities. Ongoing since September when she had an MRI. No other allergic symptoms. She is nontoxic. Reports her mentation has not helped. Exam is benign. No rashes. Denies systemic burning. Vital signs are reassuring. Basic labwork is reassuring. Etiology unknown at this time; however, given duration of symptoms, doubt emergent condition. The patient given gabapentin. Follow-up with PCP advised.  After history, exam, and medical workup I feel the  patient has been appropriately medically screened and is safe for discharge home. Pertinent diagnoses were discussed with the patient. Patient was given return precautions.   Final Clinical Impressions(s) / ED Diagnoses   Final diagnoses:  Pain in both upper extremities    New Prescriptions New Prescriptions   GABAPENTIN (NEURONTIN) 300 MG CAPSULE    Take 1 capsule (300 mg total) by mouth 2 (two) times daily.   NAPROXEN (NAPROSYN) 500 MG TABLET    Take 1 tablet (500 mg total) by mouth 2 (two) times daily.   I personally performed the services described in this documentation, which was scribed in my presence. The recorded information has been reviewed and is accurate.    Merryl Hacker, MD 05/21/16 [phone]

## 2016-06-24 ENCOUNTER — Encounter (HOSPITAL_COMMUNITY): Payer: Self-pay | Admitting: Emergency Medicine

## 2016-06-24 ENCOUNTER — Emergency Department (HOSPITAL_COMMUNITY)
Admission: EM | Admit: 2016-06-24 | Discharge: 2016-06-24 | Disposition: A | Discharge status: Home / Self Care | Payer: Medicare Other | Attending: Emergency Medicine | Admitting: Emergency Medicine

## 2016-06-24 DIAGNOSIS — A09 Infectious gastroenteritis and colitis, unspecified: Secondary | ICD-10-CM

## 2016-06-24 DIAGNOSIS — R112 Nausea with vomiting, unspecified: Secondary | ICD-10-CM | POA: Diagnosis present

## 2016-06-24 DIAGNOSIS — Z79899 Other long term (current) drug therapy: Secondary | ICD-10-CM | POA: Insufficient documentation

## 2016-06-24 DIAGNOSIS — I10 Essential (primary) hypertension: Secondary | ICD-10-CM | POA: Insufficient documentation

## 2016-06-24 LAB — GASTROINTESTINAL PANEL BY PCR, STOOL (REPLACES STOOL CULTURE)
ADENOVIRUS F40/41: NOT DETECTED
Astrovirus: NOT DETECTED
CAMPYLOBACTER SPECIES: NOT DETECTED
CRYPTOSPORIDIUM: NOT DETECTED
CYCLOSPORA CAYETANENSIS: NOT DETECTED
ENTAMOEBA HISTOLYTICA: NOT DETECTED
ENTEROPATHOGENIC E COLI (EPEC): NOT DETECTED
Enteroaggregative E coli (EAEC): NOT DETECTED
Enterotoxigenic E coli (ETEC): NOT DETECTED
Giardia lamblia: NOT DETECTED
Norovirus GI/GII: DETECTED — AB
PLESIMONAS SHIGELLOIDES: NOT DETECTED
Rotavirus A: NOT DETECTED
Salmonella species: NOT DETECTED
Sapovirus (I, II, IV, and V): NOT DETECTED
Shiga like toxin producing E coli (STEC): NOT DETECTED
Shigella/Enteroinvasive E coli (EIEC): NOT DETECTED
VIBRIO SPECIES: NOT DETECTED
Vibrio cholerae: NOT DETECTED
YERSINIA ENTEROCOLITICA: NOT DETECTED

## 2016-06-24 LAB — CBC
HEMATOCRIT: 39.3 % (ref 36.0–46.0)
HEMOGLOBIN: 13.1 g/dL (ref 12.0–15.0)
MCH: 30.3 pg (ref 26.0–34.0)
MCHC: 33.3 g/dL (ref 30.0–36.0)
MCV: 90.8 fL (ref 78.0–100.0)
Platelets: 213 10*3/uL (ref 150–400)
RBC: 4.33 MIL/uL (ref 3.87–5.11)
RDW: 14 % (ref 11.5–15.5)
WBC: 7.9 10*3/uL (ref 4.0–10.5)

## 2016-06-24 LAB — COMPREHENSIVE METABOLIC PANEL
ALBUMIN: 4.4 g/dL (ref 3.5–5.0)
ALT: 15 U/L (ref 14–54)
ANION GAP: 8 (ref 5–15)
AST: 19 U/L (ref 15–41)
Alkaline Phosphatase: 33 U/L — ABNORMAL LOW (ref 38–126)
BUN: 20 mg/dL (ref 6–20)
CO2: 26 mmol/L (ref 22–32)
Calcium: 8.9 mg/dL (ref 8.9–10.3)
Chloride: 106 mmol/L (ref 101–111)
Creatinine, Ser: 0.86 mg/dL (ref 0.44–1.00)
GFR calc non Af Amer: >60 mL/min (ref >60)
GLUCOSE: 118 mg/dL — AB (ref 65–99)
POTASSIUM: 4.2 mmol/L (ref 3.5–5.1)
SODIUM: 140 mmol/L (ref 135–145)
TOTAL PROTEIN: 7.5 g/dL (ref 6.5–8.1)
Total Bilirubin: 0.9 mg/dL (ref 0.3–1.2)

## 2016-06-24 LAB — URINALYSIS, ROUTINE W REFLEX MICROSCOPIC
BILIRUBIN URINE: NEGATIVE
Glucose, UA: NEGATIVE mg/dL
Hgb urine dipstick: NEGATIVE
Ketones, ur: NEGATIVE mg/dL
Leukocytes, UA: NEGATIVE
NITRITE: NEGATIVE
PH: 7.5 (ref 5.0–8.0)
Protein, ur: NEGATIVE mg/dL
SPECIFIC GRAVITY, URINE: 1.019 (ref 1.005–1.030)

## 2016-06-24 LAB — LIPASE, BLOOD: Lipase: 31 U/L (ref 11–51)

## 2016-06-24 MED ORDER — DICYCLOMINE HCL 20 MG PO TABS: 20.0000 mg | ORAL_TABLET | Freq: Two times a day (BID) | ORAL | 0 refills | Status: DC | PRN

## 2016-06-24 MED ORDER — CIPROFLOXACIN HCL 500 MG PO TABS
500.0000 mg | ORAL_TABLET | Freq: Once | ORAL | Status: AC
  Administered 2016-06-24: 500 mg via ORAL
  Filled 2016-06-24: qty 1

## 2016-06-24 MED ORDER — ONDANSETRON HCL 4 MG/2ML IJ SOLN
INTRAMUSCULAR | Status: AC
  Filled 2016-06-24: qty 2

## 2016-06-24 MED ORDER — DICYCLOMINE HCL 10 MG PO CAPS
10.0000 mg | ORAL_CAPSULE | Freq: Once | ORAL | Status: AC
  Administered 2016-06-24: 10 mg via ORAL
  Filled 2016-06-24: qty 1

## 2016-06-24 MED ORDER — ONDANSETRON HCL 4 MG/2ML IJ SOLN: 4.0000 mg | Freq: Once | INTRAMUSCULAR | Status: DC

## 2016-06-24 MED ORDER — SODIUM CHLORIDE 0.9 % IV BOLUS (SEPSIS)
1000.0000 mL | Freq: Once | INTRAVENOUS | Status: AC
  Administered 2016-06-24: 1000 mL via INTRAVENOUS

## 2016-06-24 MED ORDER — ONDANSETRON 4 MG PO TBDP: 4.0000 mg | ORAL_TABLET | Freq: Once | ORAL | Status: DC | PRN

## 2016-06-24 MED ORDER — HYDROCODONE-ACETAMINOPHEN 5-325 MG PO TABS: 1.0000 | ORAL_TABLET | Freq: Four times a day (QID) | ORAL | 0 refills | Status: DC | PRN

## 2016-06-24 MED ORDER — ONDANSETRON HCL 4 MG/2ML IJ SOLN
4.0000 mg | Freq: Once | INTRAMUSCULAR | Status: AC
Start: 2016-06-24 — End: 2016-06-24
  Administered 2016-06-24: 4 mg via INTRAVENOUS
  Filled 2016-06-24: qty 2

## 2016-06-24 MED ORDER — ONDANSETRON 4 MG PO TBDP: 4.0000 mg | ORAL_TABLET | Freq: Three times a day (TID) | ORAL | 0 refills | Status: DC | PRN

## 2016-06-24 MED ORDER — ONDANSETRON HCL 4 MG/2ML IJ SOLN
4.0000 mg | Freq: Once | INTRAMUSCULAR | Status: AC
  Administered 2016-06-24: 4 mg via INTRAVENOUS

## 2016-06-24 MED ORDER — CIPROFLOXACIN HCL 500 MG PO TABS: 500.0000 mg | ORAL_TABLET | Freq: Two times a day (BID) | ORAL | 0 refills | Status: DC

## 2016-06-24 MED ORDER — FENTANYL CITRATE (PF) 100 MCG/2ML IJ SOLN
50.0000 ug | Freq: Once | INTRAMUSCULAR | Status: AC
  Administered 2016-06-24: 50 ug via INTRAVENOUS
  Filled 2016-06-24: qty 2

## 2016-06-24 NOTE — ED Provider Notes (Signed)
[place] DEPT Provider Note   CSN: [id number] Arrival date & time: 06/24/16  0343     History   Chief Complaint Chief Complaint  Patient presents with  . Nausea  . Emesis  . Diarrhea    HPI Melissa Jones is a 60 y.o. female.  The history is provided by the patient, the spouse and medical records.  Emesis   Associated symptoms include abdominal pain and diarrhea. Pertinent negatives include no chills, no cough, no fever and no headaches.  Diarrhea   Associated symptoms include abdominal pain and vomiting. Pertinent negatives include no chills, no headaches and no cough.   Melissa Jones is a 60 y.o. female  with a PMH of HTN, HLD depression who presents to the Emergency Department complaining of n/v/d that began suddenly at 4pm yesterday shortly after eating a Thanksgiving lunch. Three other people at the party also having similar symptoms beginning yesterday. She states that she has had >20 episodes of emesis since yesterday and has not been able to keep anything down. 4 mg zofran given by EMS and no emesis since med administration, however she does still endorse nausea and multiple loose stools while in ED. She does endorses moderate amount of blood in each stool. She traveled to [place] last month but has been in her usual state of health until yesterday with no complaints. Denies fever/chills, back pain, dysuria, chest pain, shortness of breath.    Past Medical History:  Diagnosis Date  . Abnormal Pap smear of vagina   . Chronic back pain   . Depression    Takes Zoloft occas., no regular schedule  . Hyperlipidemia   . Hypertension     Patient Active Problem List   Diagnosis Date Noted  . BACK PAIN 10/11/2010  . CARPAL TUNNEL SYNDROME, BILATERAL 10/08/2009  . SHOULDER PAIN, LEFT 02/18/2009  . ABDOMINAL PAIN, LEFT LOWER QUADRANT 11/04/2008  . DEPRESSION, ACUTE, RECURRENT 08/16/2008  . MYALGIA 08/04/2008  . PRIMARY HYPERPARATHYROIDISM  04/14/2008  . HYPERCALCEMIA 03/18/2008  . Lumbago 03/18/2008  . HEADACHE, REBOUND 12/04/2007  . PAP SMEAR, ABNORMAL, ASCUS 08/09/2007  . HYPERTENSION 07/23/2007  . CHEST PAIN, ATYPICAL 07/23/2007  . HYPERLIPIDEMIA 07/19/2007  . ESSENTIAL HYPERTENSION, BENIGN 07/19/2007  . ASCUS (atypical squamous cells of undetermined significance) on Pap smear 07/16/2007    Past Surgical History:  Procedure Laterality Date  . COLPOSCOPY    . THYROID CYST EXCISION    . TUBAL LIGATION      OB History    Gravida Para Term Preterm AB Living   5 4 4  0 1 4   SAB TAB Ectopic Multiple Live Births   0 1 0 0         Home Medications    Prior to Admission medications   Medication Sig Start Date End Date Taking? Authorizing Provider  acetaminophen (TYLENOL) 500 MG tablet Take 1,000 mg by mouth every 6 (six) hours as needed. For pain   Yes Historical Provider, MD  atorvastatin (LIPITOR) 40 MG tablet Take 40 mg by mouth daily.   Yes Historical Provider, MD  cyclobenzaprine (FLEXERIL) 10 MG tablet Take 10 mg by mouth 3 (three) times daily as needed for muscle spasms.    Yes Historical Provider, MD  gabapentin (NEURONTIN) 300 MG capsule Take 1 capsule (300 mg total) by mouth 2 (two) times daily. Patient taking differently: Take 300 mg by mouth at bedtime.  05/21/16  Yes Merryl Hacker, MD  lisinopril (PRINIVIL,ZESTRIL) 10 MG tablet Take 20  mg by mouth daily.    Yes Historical Provider, MD  loratadine (CLARITIN) 10 MG tablet Take 10 mg by mouth daily.   Yes Historical Provider, MD  meloxicam (MOBIC) 15 MG tablet Take 15 mg by mouth daily. 06/08/16  Yes Historical Provider, MD  ranitidine (ZANTAC) 300 MG tablet Take 300 mg by mouth daily as needed for heartburn.    Yes Historical Provider, MD  sertraline (ZOLOFT) 25 MG tablet Take 25 mg by mouth daily.   Yes Historical Provider, MD  traMADol (ULTRAM) 50 MG tablet Take 1 tablet (50 mg total) by mouth every 6 (six) hours as needed. Patient taking  differently: Take 50 mg by mouth every 6 (six) hours as needed for moderate pain.  05/11/16 05/11/17 Yes Melanie Bhambri, CNM  hydrocortisone (ANUSOL-HC) 2.5 % rectal cream Place 1 application rectally 2 (two) times daily. Patient not taking: Reported on 06/24/2016 05/11/16   Julianne Handler, CNM  naproxen (NAPROSYN) 500 MG tablet Take 1 tablet (500 mg total) by mouth 2 (two) times daily. Patient not taking: Reported on 06/24/2016 05/21/16   Merryl Hacker, MD    Family History Family History  Problem Relation Age of Onset  . Sinusitis Mother   . Hypertension Mother   . Hypertension Father   . Depression Sister   . Depression Sister     Social History Social History  Substance Use Topics  . Smoking status: Never Smoker  . Smokeless tobacco: Never Used  . Alcohol use No     Allergies   Shellfish-derived products; Iodine; and Morphine and related   Review of Systems Review of Systems  Constitutional: Negative for chills and fever.  HENT: Negative for congestion.   Eyes: Negative for visual disturbance.  Respiratory: Negative for cough and shortness of breath.   Cardiovascular: Negative for chest pain.  Gastrointestinal: Positive for abdominal pain, blood in stool, diarrhea, nausea and vomiting.  Genitourinary: Negative for dysuria.  Musculoskeletal: Negative for back pain.  Skin: Negative for rash.  Neurological: Negative for headaches.     Physical Exam Updated Vital Signs BP 134/74   Pulse 87   Temp 98.8 F (37.1 C) (Oral)   Resp 22   SpO2 98%   Physical Exam  Constitutional: She is oriented to person, place, and time. She appears well-developed and well-nourished. No distress.  HENT:  Head: Normocephalic and atraumatic.  Tacky mucus membranes.  Neck: Neck supple.  Cardiovascular: Normal rate, regular rhythm and normal heart sounds.   No murmur heard. Pulmonary/Chest: Effort normal and breath sounds normal. No respiratory distress.  Abdominal: Soft.  Bowel sounds are normal. She exhibits no distension. There is tenderness (Generalized). There is no rebound and no guarding.  Neurological: She is alert and oriented to person, place, and time.  Skin: Skin is warm and dry.  Nursing note and vitals reviewed.    ED Treatments / Results  Labs (all labs ordered are listed, but only abnormal results are displayed) Labs Reviewed  COMPREHENSIVE METABOLIC PANEL - Abnormal; Notable for the following:       Result Value   Glucose, Bld 118 (*)    Alkaline Phosphatase 33 (*)    All other components within normal limits  URINALYSIS, ROUTINE W REFLEX MICROSCOPIC (NOT AT [facility]) - Abnormal; Notable for the following:    APPearance CLOUDY (*)    All other components within normal limits  GASTROINTESTINAL PANEL BY PCR, STOOL (REPLACES STOOL CULTURE)  LIPASE, BLOOD  CBC    EKG  EKG  Interpretation None       Radiology No results found.  Procedures Procedures (including critical care time)  Medications Ordered in ED Medications  sodium chloride 0.9 % bolus 1,000 mL (0 mLs Intravenous Stopped 06/24/16 0800)  ondansetron (ZOFRAN) injection 4 mg (4 mg Intravenous Given 06/24/16 0634)  dicyclomine (BENTYL) capsule 10 mg (10 mg Oral Given 06/24/16 0800)  fentaNYL (SUBLIMAZE) injection 50 mcg (50 mcg Intravenous Given 06/24/16 0800)  ondansetron (ZOFRAN) injection 4 mg ( Intravenous Not Given 06/24/16 0819)  ciprofloxacin (CIPRO) tablet 500 mg (500 mg Oral Given 06/24/16 0914)     Initial Impression / Assessment and Plan / ED Course  I have reviewed the triage vital signs and the nursing notes.  Pertinent labs & imaging results that were available during my care of the patient were reviewed by me and considered in my medical decision making (see chart for details).  Clinical Course    Melissa Jones is a 60 y.o. female who presents to ED for n/v/d that began acutely yesterday shortly after lunch at a Thanksgiving party. Three other  attendants of the party with similar sxs. On exam, patient is afebrile, vss with a non-surgical abdomen. Belly is soft with mild generalized tenderness. Does appear mildly dehydrated. Labs reviewed and reassuring. Will give zofran and fluids then reassess. Will also obtain stool sample given blood.   Patient re-evaluated. No further episodes of emesis but still states she is having loose stools every few minutes. Repeat abdominal exam with no peritoneal signs. Bentyl given for symptoms.  Patient again reevaluated, still no episodes of emesis. Given amount of blood in the stool and history, will treat with Cipro. GI PCR sent. Patient has PCP who she can follow up with and was encouraged to do so. Home care instructions including the importance of hydration were discussed. Reasons to return to the ER were discussed with patient and husband at bedside who express understanding and agreement with plan as dictated above.  Patient discussed with Dr. Roxanne Mins who agrees with treatment plan.   Final Clinical Impressions(s) / ED Diagnoses   Final diagnoses:  None    New Prescriptions New Prescriptions   No medications on file     [place], PA-C [id number]    Delora Fuel, MD [id number] [phone]

## 2016-06-24 NOTE — ED Triage Notes (Signed)
Pt comes to ed, via [facility], c/o NVD since yesterday. Comes from home,  V/s 136/86, hr 86, sp02 97, cbg 148 20 in left forearm, 4mg  Zofran, 250 ns, kvo.  Shortly after eating [place] dinner yesterday, starting feeling bad. Has been vomiting non stop with diarrhea since 1600 yesterday.

## 2016-06-24 NOTE — Discharge Instructions (Signed)
It was my pleasure taking care of you today. I hope you start feeling better soon.  Please take all of your antibiotics until finished! You will start taking the antibiotic (ciprofloxacin) tonight. You received your first dose in the ER today. Take zofran as needed for nausea and/or vomiting. Bentyl as needed for abdominal cramping. Hydrocodone is your pain medication - only take this as needed for severe pain.  I would like you to follow up with your primary care physician next week for recheck of symptoms and to ensure you are healing well. Please continue to stay hydrated, drinking small sips of water throughout the day.  Return to the ER for fevers, if you are unable to keep fluids down, new or worsening symptoms, any additional concerns.

## 2016-06-24 NOTE — ED Triage Notes (Signed)
Lab notified staff of + Nor[place] virus, Dr Zenia Resides made aware.

## 2016-06-24 NOTE — ED Notes (Signed)
Pt ambulates to BR and back to room w/o assistance 

## 2016-08-11 ENCOUNTER — Ambulatory Visit (HOSPITAL_COMMUNITY)
Admission: EM | Admit: 2016-08-11 | Discharge: 2016-08-11 | Disposition: A | Discharge status: Home / Self Care | Payer: Medicaid Other | Attending: Family Medicine | Admitting: Family Medicine

## 2016-08-11 ENCOUNTER — Encounter (HOSPITAL_COMMUNITY): Payer: Self-pay | Admitting: Emergency Medicine

## 2016-08-11 DIAGNOSIS — J069 Acute upper respiratory infection, unspecified: Secondary | ICD-10-CM | POA: Diagnosis not present

## 2016-08-11 DIAGNOSIS — J3489 Other specified disorders of nose and nasal sinuses: Secondary | ICD-10-CM

## 2016-08-11 NOTE — ED Triage Notes (Addendum)
The patient presented to the [facility] with a complaint of a headache, cough, otalgia and congestion x 1 week.

## 2016-08-11 NOTE — Discharge Instructions (Signed)
Sudafed PE 10 mg every 4 to 6 hours as needed for congestion °Allegra or Zyrtec daily as needed for drainage and runny nose. °For stronger antihistamine may take Chlor-Trimeton 2 to 4 mg every 4 to 6 hours, may cause drowsiness. °Saline nasal spray used frequently. °Ibuprofen 600 mg every 6 hours as needed for pain, discomfort or fever. °Drink plenty of fluids and stay well-hydrated. °

## 2016-08-11 NOTE — ED Provider Notes (Signed)
CSN: [id number]     Arrival date & time 08/11/16  1004 History   First MD Initiated Contact with Patient 08/11/16 1051     Chief Complaint  Patient presents with  . Cough   (Consider location/radiation/quality/duration/timing/severity/associated sxs/prior Treatment) 61 year old female complaining of a headache, nasal congestion, runny nose, PND earaches and cough for one week. She is taking an OTC medication similar to Mucinex.      Past Medical History:  Diagnosis Date  . Abnormal Pap smear of vagina   . Chronic back pain   . Depression    Takes Zoloft occas., no regular schedule  . Hyperlipidemia   . Hypertension    Past Surgical History:  Procedure Laterality Date  . COLPOSCOPY    . THYROID CYST EXCISION    . TUBAL LIGATION     Family History  Problem Relation Age of Onset  . Sinusitis Mother   . Hypertension Mother   . Hypertension Father   . Depression Sister   . Depression Sister    Social History  Substance Use Topics  . Smoking status: Never Smoker  . Smokeless tobacco: Never Used  . Alcohol use No   OB History    Gravida Para Term Preterm AB Living   5 4 4  0 1 4   SAB TAB Ectopic Multiple Live Births   0 1 0 0       Review of Systems  Constitutional: Negative for activity change, appetite change, chills, fatigue and fever.  HENT: Positive for congestion, postnasal drip, rhinorrhea and sinus pain. Negative for facial swelling.   Eyes: Negative.   Respiratory: Positive for cough. Negative for shortness of breath.   Cardiovascular: Negative.   Musculoskeletal: Negative for neck pain and neck stiffness.  Skin: Negative for pallor and rash.  Neurological: Positive for headaches.  All other systems reviewed and are negative.   Allergies  Shellfish-derived products; Iodine; and Morphine and related  Home Medications   Prior to Admission medications   Medication Sig Start Date End Date Taking? Authorizing Provider  acetaminophen (TYLENOL) 500 MG  tablet Take 1,000 mg by mouth every 6 (six) hours as needed. For pain    Historical Provider, MD  atorvastatin (LIPITOR) 40 MG tablet Take 40 mg by mouth daily.    Historical Provider, MD  ciprofloxacin (CIPRO) 500 MG tablet Take 1 tablet (500 mg total) by mouth every 12 (twelve) hours. Starting at night on 11/25. First dose given in ED. 06/24/16   Ozella Almond Ward, PA-C  cyclobenzaprine (FLEXERIL) 10 MG tablet Take 10 mg by mouth 3 (three) times daily as needed for muscle spasms.     Historical Provider, MD  dicyclomine (BENTYL) 20 MG tablet Take 1 tablet (20 mg total) by mouth 2 (two) times daily as needed (Abdominal cramping, spasms). 06/24/16   Ozella Almond Ward, PA-C  gabapentin (NEURONTIN) 300 MG capsule Take 1 capsule (300 mg total) by mouth 2 (two) times daily. Patient taking differently: Take 300 mg by mouth at bedtime.  05/21/16   Merryl Hacker, MD  HYDROcodone-acetaminophen (NORCO/VICODIN) 5-325 MG tablet Take 1 tablet by mouth every 6 (six) hours as needed for severe pain. 06/24/16   Jaime Pilcher Ward, PA-C  hydrocortisone (ANUSOL-HC) 2.5 % rectal cream Place 1 application rectally 2 (two) times daily. Patient not taking: Reported on 06/24/2016 05/11/16   Julianne Handler, CNM  lisinopril (PRINIVIL,ZESTRIL) 10 MG tablet Take 20 mg by mouth daily.     Historical Provider, MD  loratadine (CLARITIN)  10 MG tablet Take 10 mg by mouth daily.    Historical Provider, MD  meloxicam (MOBIC) 15 MG tablet Take 15 mg by mouth daily. 06/08/16   Historical Provider, MD  naproxen (NAPROSYN) 500 MG tablet Take 1 tablet (500 mg total) by mouth 2 (two) times daily. Patient not taking: Reported on 06/24/2016 05/21/16   Merryl Hacker, MD  ondansetron (ZOFRAN ODT) 4 MG disintegrating tablet Take 1 tablet (4 mg total) by mouth every 8 (eight) hours as needed for nausea or vomiting. 06/24/16   Jaime Pilcher Ward, PA-C  ranitidine (ZANTAC) 300 MG tablet Take 300 mg by mouth daily as needed for  heartburn.     Historical Provider, MD  sertraline (ZOLOFT) 25 MG tablet Take 25 mg by mouth daily.    Historical Provider, MD  traMADol (ULTRAM) 50 MG tablet Take 1 tablet (50 mg total) by mouth every 6 (six) hours as needed. Patient taking differently: Take 50 mg by mouth every 6 (six) hours as needed for moderate pain.  05/11/16 05/11/17  Julianne Handler, CNM   Meds Ordered and Administered this Visit  Medications - No data to display  BP 133/85 (BP Location: Right Arm)   Pulse 72   Temp 98 F (36.7 C) (Oral)   Resp 12   SpO2 96%  No data found.   Physical Exam  Constitutional: She is oriented to person, place, and time. She appears well-developed and well-nourished. No distress.  HENT:  Mouth/Throat: No oropharyngeal exudate.  Bilateral TMs difficult to see secondary to cerumen. Oropharynx with minor erythema and clear PND and much cobblestoning.  Neck: Normal range of motion. Neck supple.  Cardiovascular: Normal rate and regular rhythm.   Pulmonary/Chest: Effort normal and breath sounds normal. No respiratory distress. She has no wheezes. She has no rales.  Musculoskeletal: Normal range of motion. She exhibits no edema.  Lymphadenopathy:    She has no cervical adenopathy.  Neurological: She is alert and oriented to person, place, and time.  Skin: Skin is warm and dry. No rash noted.  Psychiatric: She has a normal mood and affect.  Nursing note and vitals reviewed.   Urgent Care Course   Clinical Course     Procedures (including critical care time)  Labs Review Labs Reviewed - No data to display  Imaging Review No results found.   Visual Acuity Review  Right Eye Distance:   Left Eye Distance:   Bilateral Distance:    Right Eye Near:   Left Eye Near:    Bilateral Near:         MDM   1. Acute upper respiratory infection   2. Sinus pain    Sudafed PE 10 mg every 4 to 6 hours as needed for congestion Allegra or Zyrtec daily as needed for drainage  and runny nose. For stronger antihistamine may take Chlor-Trimeton 2 to 4 mg every 4 to 6 hours, may cause drowsiness. Saline nasal spray used frequently. Ibuprofen 600 mg every 6 hours as needed for pain, discomfort or fever. Drink plenty of fluids and stay well-hydrated.     Janne Napoleon, NP 08/11/16 1101

## 2016-09-15 ENCOUNTER — Ambulatory Visit: Payer: Medicare (Managed Care) | Admitting: Obstetrics & Gynecology

## 2016-10-06 ENCOUNTER — Other Ambulatory Visit (HOSPITAL_COMMUNITY)
Admission: RE | Admit: 2016-10-06 | Discharge: 2016-10-06 | Disposition: A | Discharge status: Home / Self Care | Payer: Medicare Other | Source: Ambulatory Visit | Attending: Obstetrics & Gynecology | Admitting: Obstetrics & Gynecology

## 2016-10-06 ENCOUNTER — Ambulatory Visit (INDEPENDENT_AMBULATORY_CARE_PROVIDER_SITE_OTHER): Payer: Medicare Other | Admitting: Obstetrics & Gynecology

## 2016-10-06 ENCOUNTER — Encounter: Payer: Self-pay | Admitting: Obstetrics & Gynecology

## 2016-10-06 ENCOUNTER — Other Ambulatory Visit: Payer: Self-pay | Admitting: Obstetrics & Gynecology

## 2016-10-06 VITALS — BP 144/76 | HR 72 | Wt 135.9 lb

## 2016-10-06 DIAGNOSIS — Z01419 Encounter for gynecological examination (general) (routine) without abnormal findings: Secondary | ICD-10-CM

## 2016-10-06 DIAGNOSIS — Z1151 Encounter for screening for human papillomavirus (HPV): Secondary | ICD-10-CM | POA: Diagnosis present

## 2016-10-06 DIAGNOSIS — Z1231 Encounter for screening mammogram for malignant neoplasm of breast: Secondary | ICD-10-CM

## 2016-10-06 DIAGNOSIS — Z0000 Encounter for general adult medical examination without abnormal findings: Secondary | ICD-10-CM | POA: Diagnosis not present

## 2016-10-06 NOTE — Progress Notes (Signed)
Subjective:    Melissa Jones is a 61 y.o. MH P4 female who presents for an annual exam. The patient has no complaints today. The patient is not currently sexually active. GYN screening history: last pap: was normal. The patient wears seatbelts: yes. The patient participates in regular exercise: yes. Has the patient ever been transfused or tattooed?: no. The patient reports that there is not domestic violence in her life.   Menstrual History: OB History    Gravida Para Term Preterm AB Living   5 4 4  0 1 4   SAB TAB Ectopic Multiple Live Births   0 1 0 0        Menarche age: 45 No LMP recorded. Patient is postmenopausal.    The following portions of the patient's history were reviewed and updated as appropriate: allergies, current medications, past family history, past medical history, past social history, past surgical history and problem list.  Review of Systems Pertinent items are noted in HPI.   FH- no breast/gyn/colon cancer Homemaker   Objective:    BP (!) 144/76   Pulse 72   Wt 135 lb 14.4 oz (61.6 kg)   BMI 24.86 kg/m   General Appearance:    Alert, cooperative, no distress, appears stated age  Head:    Normocephalic, without obvious abnormality, atraumatic  Eyes:    PERRL, conjunctiva/corneas clear, EOM's intact, fundi    benign, both eyes  Ears:    Normal TM's and external ear canals, both ears  Nose:   Nares normal, septum midline, mucosa normal, no drainage    or sinus tenderness  Throat:   Lips, mucosa, and tongue normal; teeth and gums normal  Neck:   Supple, symmetrical, trachea midline, no adenopathy;    thyroid:  no enlargement/tenderness/nodules; no carotid   bruit or JVD  Back:     Symmetric, no curvature, ROM normal, no CVA tenderness  Lungs:     Clear to auscultation bilaterally, respirations unlabored  Chest Wall:    No tenderness or deformity   Heart:    Regular rate and rhythm, S1 and S2 normal, no murmur, rub   or gallop  Breast Exam:    No  tenderness, masses, or nipple abnormality  Abdomen:     Soft, non-tender, bowel sounds active all four quadrants,    no masses, no organomegaly  Genitalia:    Normal female without lesion, discharge or tenderness, severe atrophy, NSSR, NT, no adnexal masses     Extremities:   Extremities normal, atraumatic, no cyanosis or edema  Pulses:   2+ and symmetric all extremities  Skin:   Skin color, texture, turgor normal, no rashes or lesions  Lymph nodes:   Cervical, supraclavicular, and axillary nodes normal  Neurologic:   CNII-XII intact, normal strength, sensation and reflexes    throughout  .    Assessment:    Healthy female exam.    Plan:     Thin prep Pap smear. with cotesting mammo

## 2016-10-10 LAB — CYTOLOGY - PAP
Diagnosis: NEGATIVE
HPV: NOT DETECTED

## 2016-10-26 ENCOUNTER — Ambulatory Visit
Admission: RE | Admit: 2016-10-26 | Discharge: 2016-10-26 | Disposition: A | Discharge status: Home / Self Care | Payer: Medicare Other | Source: Ambulatory Visit | Attending: Obstetrics & Gynecology | Admitting: Obstetrics & Gynecology

## 2016-10-26 DIAGNOSIS — Z1231 Encounter for screening mammogram for malignant neoplasm of breast: Secondary | ICD-10-CM

## 2017-07-03 ENCOUNTER — Other Ambulatory Visit: Payer: Self-pay | Admitting: Gastroenterology

## 2017-07-03 DIAGNOSIS — R1011 Right upper quadrant pain: Secondary | ICD-10-CM

## 2017-07-06 ENCOUNTER — Other Ambulatory Visit: Payer: Self-pay | Admitting: Gastroenterology

## 2017-07-06 ENCOUNTER — Ambulatory Visit
Admission: RE | Admit: 2017-07-06 | Discharge: 2017-07-06 | Disposition: A | Discharge status: Home / Self Care | Payer: Medicare Other | Source: Ambulatory Visit | Attending: Gastroenterology | Admitting: Gastroenterology

## 2017-07-06 DIAGNOSIS — R1011 Right upper quadrant pain: Secondary | ICD-10-CM

## 2017-09-21 ENCOUNTER — Other Ambulatory Visit (HOSPITAL_COMMUNITY): Payer: Self-pay | Admitting: Physician Assistant

## 2017-09-21 DIAGNOSIS — R1011 Right upper quadrant pain: Secondary | ICD-10-CM

## 2017-09-28 ENCOUNTER — Encounter (HOSPITAL_COMMUNITY): Payer: Self-pay

## 2017-09-28 ENCOUNTER — Encounter (HOSPITAL_COMMUNITY)
Admission: RE | Admit: 2017-09-28 | Discharge: 2017-09-28 | Disposition: A | Discharge status: Home / Self Care | Payer: Medicare Other | Source: Ambulatory Visit | Attending: Physician Assistant | Admitting: Physician Assistant

## 2017-09-28 DIAGNOSIS — R1011 Right upper quadrant pain: Secondary | ICD-10-CM | POA: Insufficient documentation

## 2017-10-09 ENCOUNTER — Other Ambulatory Visit (HOSPITAL_COMMUNITY): Payer: Self-pay | Admitting: Gastroenterology

## 2017-10-09 DIAGNOSIS — R1011 Right upper quadrant pain: Secondary | ICD-10-CM

## 2017-10-19 ENCOUNTER — Ambulatory Visit (HOSPITAL_COMMUNITY)
Admission: RE | Admit: 2017-10-19 | Discharge: 2017-10-19 | Disposition: A | Discharge status: Home / Self Care | Payer: Medicare Other | Source: Ambulatory Visit | Attending: Physician Assistant | Admitting: Physician Assistant

## 2017-10-19 DIAGNOSIS — R1011 Right upper quadrant pain: Secondary | ICD-10-CM | POA: Diagnosis present

## 2017-10-19 MED ORDER — TECHNETIUM TC 99M MEBROFENIN IV KIT
5.4000 | PACK | Freq: Once | INTRAVENOUS | Status: AC | PRN
  Administered 2017-10-19: 5.4 via INTRAVENOUS

## 2017-10-22 ENCOUNTER — Encounter (HOSPITAL_COMMUNITY): Payer: Self-pay | Admitting: Emergency Medicine

## 2017-10-22 DIAGNOSIS — K649 Unspecified hemorrhoids: Secondary | ICD-10-CM | POA: Diagnosis not present

## 2017-10-22 DIAGNOSIS — R112 Nausea with vomiting, unspecified: Secondary | ICD-10-CM | POA: Diagnosis not present

## 2017-10-22 DIAGNOSIS — R197 Diarrhea, unspecified: Secondary | ICD-10-CM | POA: Diagnosis present

## 2017-10-22 DIAGNOSIS — I10 Essential (primary) hypertension: Secondary | ICD-10-CM | POA: Diagnosis not present

## 2017-10-22 DIAGNOSIS — Z79899 Other long term (current) drug therapy: Secondary | ICD-10-CM | POA: Diagnosis not present

## 2017-10-22 LAB — CBC
HEMATOCRIT: 42.1 % (ref 36.0–46.0)
HEMOGLOBIN: 13.8 g/dL (ref 12.0–15.0)
MCH: 29.3 pg (ref 26.0–34.0)
MCHC: 32.8 g/dL (ref 30.0–36.0)
MCV: 89.4 fL (ref 78.0–100.0)
Platelets: 230 10*3/uL (ref 150–400)
RBC: 4.71 MIL/uL (ref 3.87–5.11)
RDW: 15 % (ref 11.5–15.5)
WBC: 6.6 10*3/uL (ref 4.0–10.5)

## 2017-10-22 NOTE — ED Triage Notes (Signed)
Patient c/o diarrhea since yesterday with N/V/D and lower abdominal pain today. Reports bright red rectal bleeding today. Ambulatory.

## 2017-10-22 NOTE — ED Notes (Signed)
PT HAS URINE SAMPLE IN TRIAGE IF NEEDED

## 2017-10-23 ENCOUNTER — Emergency Department (HOSPITAL_COMMUNITY)
Admission: EM | Admit: 2017-10-23 | Discharge: 2017-10-23 | Disposition: A | Discharge status: Home / Self Care | Payer: Medicare Other | Attending: Emergency Medicine | Admitting: Emergency Medicine

## 2017-10-23 DIAGNOSIS — K649 Unspecified hemorrhoids: Secondary | ICD-10-CM

## 2017-10-23 DIAGNOSIS — R112 Nausea with vomiting, unspecified: Secondary | ICD-10-CM | POA: Diagnosis not present

## 2017-10-23 DIAGNOSIS — R197 Diarrhea, unspecified: Secondary | ICD-10-CM

## 2017-10-23 LAB — COMPREHENSIVE METABOLIC PANEL
ALBUMIN: 4 g/dL (ref 3.5–5.0)
ALK PHOS: 46 U/L (ref 38–126)
ALT: 24 U/L (ref 14–54)
ANION GAP: 8 (ref 5–15)
AST: 22 U/L (ref 15–41)
BUN: 12 mg/dL (ref 6–20)
CALCIUM: 9.2 mg/dL (ref 8.9–10.3)
CO2: 26 mmol/L (ref 22–32)
Chloride: 106 mmol/L (ref 101–111)
Creatinine, Ser: 0.73 mg/dL (ref 0.44–1.00)
GFR calc non Af Amer: >60 mL/min (ref >60)
Glucose, Bld: 97 mg/dL (ref 65–99)
POTASSIUM: 4.1 mmol/L (ref 3.5–5.1)
Sodium: 140 mmol/L (ref 135–145)
TOTAL PROTEIN: 7.8 g/dL (ref 6.5–8.1)
Total Bilirubin: 0.3 mg/dL (ref 0.3–1.2)

## 2017-10-23 LAB — ABO/RH: ABO/RH(D): O POS

## 2017-10-23 LAB — TYPE AND SCREEN
ABO/RH(D): O POS
Antibody Screen: NEGATIVE

## 2017-10-23 LAB — POC OCCULT BLOOD, ED: Fecal Occult Bld: POSITIVE — AB

## 2017-10-23 MED ORDER — SODIUM CHLORIDE 0.9 % IV BOLUS
1000.0000 mL | Freq: Once | INTRAVENOUS | Status: AC
  Administered 2017-10-23: 1000 mL via INTRAVENOUS

## 2017-10-23 MED ORDER — ONDANSETRON 4 MG PO TBDP: 4.0000 mg | ORAL_TABLET | Freq: Three times a day (TID) | ORAL | 0 refills | Status: DC | PRN

## 2017-10-23 MED ORDER — ONDANSETRON HCL 4 MG/2ML IJ SOLN
4.0000 mg | Freq: Once | INTRAMUSCULAR | Status: AC
  Administered 2017-10-23: 4 mg via INTRAVENOUS
  Filled 2017-10-23: qty 2

## 2017-10-23 NOTE — Discharge Instructions (Addendum)
Please rest and drink plenty of fluids Take Zofran for nausea Follow up with your doctor Return if worsening

## 2017-10-23 NOTE — ED Provider Notes (Signed)
[facility] DEPT Provider Note   CSN: [id number] Arrival date & time: 10/22/17  2132     History   Chief Complaint Chief Complaint  Patient presents with  . Diarrhea  . Rectal Bleeding    HPI Melissa Jones is a 62 y.o. female who presents with abdominal pain, nausea, vomiting, diarrhea.  Past medical history significant for chronic right upper quadrant abdominal pain, hypertension, hyperlipidemia.  Past surgical history significant for tubal ligation.  She states that she had acute onset of generalized abdominal pain, nausea, vomiting, diarrhea starting yesterday.  Her daughter started having similar symptoms after her.  She has had 6-7 episodes of diarrhea which were initially loose but now is watery.  After an episode of diarrhea she wiped and saw a small amount of blood on the toilet paper.  This concerned her so she came to the ED.  She denies fever, chills, chest pain, shortness of breath, dysuria, vaginal discharge.  She states she has had an endoscopy and colonoscopy in the past which were remarkable for gastritis, diverticulosis, hemorrhoids.  HPI  Past Medical History:  Diagnosis Date  . Abnormal Pap smear of vagina   . Chronic back pain   . Depression    Takes Zoloft occas., no regular schedule  . Hyperlipidemia   . Hypertension     Patient Active Problem List   Diagnosis Date Noted  . BACK PAIN 10/11/2010  . CARPAL TUNNEL SYNDROME, BILATERAL 10/08/2009  . SHOULDER PAIN, LEFT 02/18/2009  . ABDOMINAL PAIN, LEFT LOWER QUADRANT 11/04/2008  . DEPRESSION, ACUTE, RECURRENT 08/16/2008  . MYALGIA 08/04/2008  . PRIMARY HYPERPARATHYROIDISM 04/14/2008  . HYPERCALCEMIA 03/18/2008  . Lumbago 03/18/2008  . HEADACHE, REBOUND 12/04/2007  . PAP SMEAR, ABNORMAL, ASCUS 08/09/2007  . HYPERTENSION 07/23/2007  . CHEST PAIN, ATYPICAL 07/23/2007  . HYPERLIPIDEMIA 07/19/2007  . ESSENTIAL HYPERTENSION, BENIGN 07/19/2007  . ASCUS (atypical  squamous cells of undetermined significance) on Pap smear 07/16/2007    Past Surgical History:  Procedure Laterality Date  . COLPOSCOPY    . THYROID CYST EXCISION    . TUBAL LIGATION       OB History    Gravida  5   Para  4   Term  4   Preterm  0   AB  1   Living  4     SAB  0   TAB  1   Ectopic  0   Multiple  0   Live Births               Home Medications    Prior to Admission medications   Medication Sig Start Date End Date Taking? Authorizing Provider  acetaminophen (TYLENOL) 500 MG tablet Take 1,000 mg by mouth every 6 (six) hours as needed. For pain    [provider]  atorvastatin (LIPITOR) 40 MG tablet Take 40 mg by mouth daily.    [provider]  cyclobenzaprine (FLEXERIL) 10 MG tablet Take 10 mg by mouth 3 (three) times daily as needed for muscle spasms.     [provider]  dicyclomine (BENTYL) 20 MG tablet Take 1 tablet (20 mg total) by mouth 2 (two) times daily as needed (Abdominal cramping, spasms). 06/24/16   Ward, Ozella Almond, PA-C  gabapentin (NEURONTIN) 300 MG capsule Take 1 capsule (300 mg total) by mouth 2 (two) times daily. Patient taking differently: Take 300 mg by mouth at bedtime.  05/21/16   Horton, Barbette Hair, MD  HYDROcodone-acetaminophen (NORCO/VICODIN) [phone]  MG tablet Take 1 tablet by mouth every 6 (six) hours as needed for severe pain. Patient not taking: Reported on 10/06/2016 06/24/16   Ward, Ozella Almond, PA-C  hydrocortisone (ANUSOL-HC) 2.5 % rectal cream Place 1 application rectally 2 (two) times daily. Patient not taking: Reported on 10/06/2016 05/11/16   Julianne Handler, CNM  lisinopril (PRINIVIL,ZESTRIL) 10 MG tablet Take 20 mg by mouth daily.     [provider]  loratadine (CLARITIN) 10 MG tablet Take 10 mg by mouth daily.    [provider]  meloxicam (MOBIC) 15 MG tablet Take 15 mg by mouth daily. 06/08/16   [provider]  ranitidine (ZANTAC) 300 MG tablet Take 300  mg by mouth daily as needed for heartburn.     [provider]  sertraline (ZOLOFT) 25 MG tablet Take 25 mg by mouth daily.    [provider]    Family History Family History  Problem Relation Age of Onset  . Sinusitis Mother   . Hypertension Mother   . Hypertension Father   . Depression Sister   . Depression Sister     Social History Social History   Tobacco Use  . Smoking status: Never Smoker  . Smokeless tobacco: Never Used  Substance Use Topics  . Alcohol use: No  . Drug use: No     Allergies   Shellfish-derived products; Iodine; and Morphine and related   Review of Systems Review of Systems  Constitutional: Negative for chills and fever.  Gastrointestinal: Positive for abdominal pain, blood in stool, diarrhea, nausea and vomiting.  Genitourinary: Negative for dysuria and frequency.  Neurological: Positive for light-headedness.  All other systems reviewed and are negative.    Physical Exam Updated Vital Signs BP 125/82   Pulse 77   Temp 99.7 F (37.6 C) (Oral)   Resp 16   Ht 5\' 3"  (1.6 m)   Wt 59.7 kg (131 lb 11.2 oz)   SpO2 99%   BMI 23.33 kg/m   Physical Exam  Constitutional: She is oriented to person, place, and time. She appears well-developed and well-nourished. No distress.  Appears fatigued  HENT:  Head: Normocephalic and atraumatic.  Eyes: Pupils are equal, round, and reactive to light. Conjunctivae are normal. Right eye exhibits no discharge. Left eye exhibits no discharge. No scleral icterus.  Neck: Normal range of motion.  Cardiovascular: Normal rate and regular rhythm.  Pulmonary/Chest: Effort normal and breath sounds normal. No respiratory distress.  Abdominal: Soft. Bowel sounds are normal. She exhibits no distension. There is no tenderness.  Genitourinary:  Genitourinary Comments: Rectal: External hemorrhoids noted. No gross blood, fissures, redness, area of fluctuance, lesions, or tenderness. Chaperone present  during exam.   Neurological: She is alert and oriented to person, place, and time.  Skin: Skin is warm and dry.  Psychiatric: She has a normal mood and affect. Her behavior is normal.  Nursing note and vitals reviewed.    ED Treatments / Results  Labs (all labs ordered are listed, but only abnormal results are displayed) Labs Reviewed  POC OCCULT BLOOD, ED - Abnormal; Notable for the following components:      Result Value   Fecal Occult Bld POSITIVE (*)    All other components within normal limits  COMPREHENSIVE METABOLIC PANEL  CBC  TYPE AND SCREEN  ABO/RH    EKG None  Radiology No results found.  Procedures Procedures (including critical care time)  Medications Ordered in ED Medications  sodium chloride 0.9 % bolus 1,000  mL (1,000 mLs Intravenous Given 10/23/17 0317)  ondansetron (ZOFRAN) injection 4 mg (4 mg Intravenous Given 10/23/17 0317)     Initial Impression / Assessment and Plan / ED Course  I have reviewed the triage vital signs and the nursing notes.  Pertinent labs & imaging results that were available during my care of the patient were reviewed by me and considered in my medical decision making (see chart for details).  62 year old female who presents with abdominal pain N/V/D for 24 hours. Likely viral gastroenteritis. She is mildly hypertensive but otherwise vitals are normal. Her abdomen is soft, benign. Her rectal exam is remarkable for hemorrhoids which is likely the cause of her bleeding. She did not have gross blood on digital exam but hemoccult was positive. Her labs are unremarkable. She was given fluids and antiemetics. Will defer imaging at this time.  On recheck she is feeling somewhat better. She tolerated PO. She was given rx for Zofran and strict return precautions.  Final Clinical Impressions(s) / ED Diagnoses   Final diagnoses:  Nausea vomiting and diarrhea  Hemorrhoids, unspecified hemorrhoid type    ED Discharge Orders    None         Recardo Evangelist, PA-C 10/23/17 2241    Varney Biles, MD 10/25/17 2317

## 2017-10-23 NOTE — ED Notes (Signed)
Pt tolerated ginger ale with no difficulty

## 2017-10-23 NOTE — ED Notes (Signed)
Patient had a positive occult stool

## 2018-01-16 ENCOUNTER — Ambulatory Visit: Payer: Medicare Other | Admitting: Obstetrics & Gynecology

## 2018-01-16 NOTE — Progress Notes (Signed)
Per Dr. Hulan Fray pt does not need to be contacted in regards to missed appt.

## 2018-01-30 ENCOUNTER — Encounter: Payer: Self-pay | Admitting: Obstetrics and Gynecology

## 2018-01-30 ENCOUNTER — Ambulatory Visit (INDEPENDENT_AMBULATORY_CARE_PROVIDER_SITE_OTHER): Payer: Medicare Other | Admitting: Obstetrics and Gynecology

## 2018-01-30 ENCOUNTER — Other Ambulatory Visit (HOSPITAL_COMMUNITY)
Admission: RE | Admit: 2018-01-30 | Discharge: 2018-01-30 | Disposition: A | Discharge status: Home / Self Care | Payer: Medicare Other | Source: Ambulatory Visit | Attending: Obstetrics and Gynecology | Admitting: Obstetrics and Gynecology

## 2018-01-30 VITALS — BP 123/84 | HR 63 | Resp 16 | Wt 133.1 lb

## 2018-01-30 DIAGNOSIS — Z01419 Encounter for gynecological examination (general) (routine) without abnormal findings: Secondary | ICD-10-CM | POA: Diagnosis present

## 2018-01-30 NOTE — Progress Notes (Signed)
Subjective:     Melissa Jones is a 62 y.o. female G5P4 postmenopausal with BMI 23 who is here for a comprehensive physical exam. The patient reports no problems. She is not sexually active. She denies any episodes of vaginal bleeding. She denies any urinary incontinence. She denies pelvic pain or abnormal discharge. Patient with normal mammogram 11/2017 [facility])  Past Medical History:  Diagnosis Date  . Abnormal Pap smear of vagina   . Chronic back pain   . Depression    Takes Zoloft occas., no regular schedule  . Hyperlipidemia   . Hypertension    Past Surgical History:  Procedure Laterality Date  . COLPOSCOPY    . THYROID CYST EXCISION    . TUBAL LIGATION     Family History  Problem Relation Age of Onset  . Sinusitis Mother   . Hypertension Mother   . Hypertension Father   . Depression Sister   . Depression Sister     Social History   Socioeconomic History  . Marital status: Married    Spouse name: Not on file  . Number of children: Not on file  . Years of education: Not on file  . Highest education level: Not on file  Occupational History  . Not on file  Social Needs  . Financial resource strain: Not on file  . Food insecurity:    Worry: Not on file    Inability: Not on file  . Transportation needs:    Medical: Not on file    Non-medical: Not on file  Tobacco Use  . Smoking status: Never Smoker  . Smokeless tobacco: Never Used  Substance and Sexual Activity  . Alcohol use: No  . Drug use: No  . Sexual activity: Never    Birth control/protection: Post-menopausal  Lifestyle  . Physical activity:    Days per week: Not on file    Minutes per session: Not on file  . Stress: Not on file  Relationships  . Social connections:    Talks on phone: Not on file    Gets together: Not on file    Attends religious service: Not on file    Active member of club or organization: Not on file    Attends meetings of clubs or organizations: Not on file   Relationship status: Not on file  . Intimate partner violence:    Fear of current or ex partner: Not on file    Emotionally abused: Not on file    Physically abused: Not on file    Forced sexual activity: Not on file  Other Topics Concern  . Not on file  Social History Narrative  . Not on file   Health Maintenance  Topic Date Due  . Hepatitis C Screening  20-Oct-1955  . HIV Screening  03/25/1971  . TETANUS/TDAP  03/25/1975  . COLONOSCOPY  03/24/2006  . INFLUENZA VACCINE  02/28/2018  . MAMMOGRAM  10/27/2018  . PAP SMEAR  10/07/2019       Review of Systems Pertinent items are noted in HPI.   Objective:  Blood pressure 123/84, pulse 63, resp. rate 16, weight 133 lb 1.3 oz (60.4 kg).     GENERAL: Well-developed, well-nourished female in no acute distress.  HEENT: Normocephalic, atraumatic. Sclerae anicteric.  NECK: Supple. Normal thyroid.  LUNGS: Clear to auscultation bilaterally.  HEART: Regular rate and rhythm. BREASTS: Symmetric in size. No palpable masses or lymphadenopathy, skin changes, or nipple drainage. ABDOMEN: Soft, nontender, nondistended. No organomegaly. PELVIC: Normal external female  genitalia. Vagina is atrophic. Normal discharge. Normal appearing cervix. Uterus is normal in size. No adnexal mass or tenderness. EXTREMITIES: No cyanosis, clubbing, or edema, 2+ distal pulses.    Assessment:    Healthy female exam.      Plan:    Pap smear collected Follow up with PCP as scheduled Patient will be contacted with abnormal results See After Visit Summary for Counseling Recommendations

## 2018-02-04 LAB — CYTOLOGY - PAP
Diagnosis: NEGATIVE
HPV: NOT DETECTED

## 2020-04-28 ENCOUNTER — Emergency Department (HOSPITAL_COMMUNITY)
Admission: EM | Admit: 2020-04-28 | Discharge: 2020-04-28 | Disposition: A | Discharge status: Home / Self Care | Payer: Medicare Other | Attending: Emergency Medicine | Admitting: Emergency Medicine

## 2020-04-28 ENCOUNTER — Emergency Department (HOSPITAL_COMMUNITY): Payer: Medicare Other

## 2020-04-28 ENCOUNTER — Other Ambulatory Visit: Payer: Self-pay

## 2020-04-28 DIAGNOSIS — S301XXA Contusion of abdominal wall, initial encounter: Secondary | ICD-10-CM | POA: Diagnosis not present

## 2020-04-28 DIAGNOSIS — Z79899 Other long term (current) drug therapy: Secondary | ICD-10-CM | POA: Diagnosis not present

## 2020-04-28 DIAGNOSIS — S0990XA Unspecified injury of head, initial encounter: Secondary | ICD-10-CM | POA: Diagnosis not present

## 2020-04-28 DIAGNOSIS — I10 Essential (primary) hypertension: Secondary | ICD-10-CM | POA: Insufficient documentation

## 2020-04-28 DIAGNOSIS — S2001XA Contusion of right breast, initial encounter: Secondary | ICD-10-CM | POA: Insufficient documentation

## 2020-04-28 DIAGNOSIS — Y92410 Unspecified street and highway as the place of occurrence of the external cause: Secondary | ICD-10-CM | POA: Diagnosis not present

## 2020-04-28 DIAGNOSIS — S299XXA Unspecified injury of thorax, initial encounter: Secondary | ICD-10-CM | POA: Diagnosis present

## 2020-04-28 DIAGNOSIS — Y9389 Activity, other specified: Secondary | ICD-10-CM | POA: Insufficient documentation

## 2020-04-28 LAB — CBC
HCT: 39.1 % (ref 36.0–46.0)
Hemoglobin: 12.7 g/dL (ref 12.0–15.0)
MCH: 29.1 pg (ref 26.0–34.0)
MCHC: 32.5 g/dL (ref 30.0–36.0)
MCV: 89.7 fL (ref 80.0–100.0)
Platelets: 251 10*3/uL (ref 150–400)
RBC: 4.36 MIL/uL (ref 3.87–5.11)
RDW: 13.1 % (ref 11.5–15.5)
WBC: 8.5 10*3/uL (ref 4.0–10.5)
nRBC: 0 % (ref 0.0–0.2)

## 2020-04-28 LAB — I-STAT CHEM 8, ED
BUN: 12 mg/dL (ref 8–23)
Calcium, Ion: 0.96 mmol/L — ABNORMAL LOW (ref 1.15–1.40)
Chloride: 106 mmol/L (ref 98–111)
Creatinine, Ser: 0.7 mg/dL (ref 0.44–1.00)
Glucose, Bld: 88 mg/dL (ref 70–99)
HCT: 38 % (ref 36.0–46.0)
Hemoglobin: 12.9 g/dL (ref 12.0–15.0)
Potassium: 4.2 mmol/L (ref 3.5–5.1)
Sodium: 137 mmol/L (ref 135–145)
TCO2: 24 mmol/L (ref 22–32)

## 2020-04-28 LAB — URINALYSIS, ROUTINE W REFLEX MICROSCOPIC
Bilirubin Urine: NEGATIVE
Glucose, UA: NEGATIVE mg/dL
Hgb urine dipstick: NEGATIVE
Ketones, ur: NEGATIVE mg/dL
Leukocytes,Ua: NEGATIVE
Nitrite: NEGATIVE
Protein, ur: NEGATIVE mg/dL
Specific Gravity, Urine: 1.011 (ref 1.005–1.030)
pH: 7 (ref 5.0–8.0)

## 2020-04-28 LAB — COMPREHENSIVE METABOLIC PANEL
ALT: 18 U/L (ref 0–44)
AST: 20 U/L (ref 15–41)
Albumin: 3.8 g/dL (ref 3.5–5.0)
Alkaline Phosphatase: 27 U/L — ABNORMAL LOW (ref 38–126)
Anion gap: 12 (ref 5–15)
BUN: 10 mg/dL (ref 8–23)
CO2: 23 mmol/L (ref 22–32)
Calcium: 9.2 mg/dL (ref 8.9–10.3)
Chloride: 103 mmol/L (ref 98–111)
Creatinine, Ser: 0.72 mg/dL (ref 0.44–1.00)
GFR calc Af Amer: >60 mL/min (ref >60)
GFR calc non Af Amer: >60 mL/min (ref >60)
Glucose, Bld: 94 mg/dL (ref 70–99)
Potassium: 3.4 mmol/L — ABNORMAL LOW (ref 3.5–5.1)
Sodium: 138 mmol/L (ref 135–145)
Total Bilirubin: 0.7 mg/dL (ref 0.3–1.2)
Total Protein: 7.7 g/dL (ref 6.5–8.1)

## 2020-04-28 LAB — PROTIME-INR
INR: 1 (ref 0.8–1.2)
Prothrombin Time: 12.8 seconds (ref 11.4–15.2)

## 2020-04-28 LAB — LACTIC ACID, PLASMA: Lactic Acid, Venous: 1.8 mmol/L (ref 0.5–1.9)

## 2020-04-28 MED ORDER — HYDROCODONE-ACETAMINOPHEN 5-325 MG PO TABS
1.0000 | ORAL_TABLET | Freq: Four times a day (QID) | ORAL | 0 refills | Status: DC | PRN
Start: 2020-04-28 — End: 2021-10-16

## 2020-04-28 MED ORDER — METHOCARBAMOL 500 MG PO TABS: 500.0000 mg | ORAL_TABLET | Freq: Two times a day (BID) | ORAL | 0 refills | Status: DC

## 2020-04-28 MED ORDER — LIDOCAINE 5 % EX PTCH
1.0000 | MEDICATED_PATCH | Freq: Once | CUTANEOUS | Status: DC
  Administered 2020-04-28: 1 via TRANSDERMAL
  Filled 2020-04-28: qty 1

## 2020-04-28 MED ORDER — SODIUM CHLORIDE 0.9 % IV BOLUS
500.0000 mL | Freq: Once | INTRAVENOUS | Status: AC
  Administered 2020-04-28: 14:00:00 500 mL via INTRAVENOUS

## 2020-04-28 MED ORDER — FENTANYL CITRATE (PF) 100 MCG/2ML IJ SOLN
50.0000 ug | Freq: Once | INTRAMUSCULAR | Status: AC
  Administered 2020-04-28: 14:00:00 50 ug via INTRAVENOUS
  Filled 2020-04-28: qty 2

## 2020-04-28 MED ORDER — FENTANYL CITRATE (PF) 100 MCG/2ML IJ SOLN
50.0000 ug | Freq: Once | INTRAMUSCULAR | Status: AC
  Administered 2020-04-28: 50 ug via INTRAVENOUS
  Filled 2020-04-28: qty 2

## 2020-04-28 MED ORDER — IOHEXOL 300 MG/ML  SOLN
100.0000 mL | Freq: Once | INTRAMUSCULAR | Status: AC | PRN
  Administered 2020-04-28: 100 mL via INTRAVENOUS
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
## 2020-04-28 NOTE — Discharge Instructions (Signed)
The pain you are experiencing is likely due to contusion (bruising) and muscle strain, you may take Ibuprofen and prescribed Norco for breakthrough pain, and Robaxin as needed for muscle spasm. Do not combine with any pain reliever other than tylenol 650 mg every 6 hours. Norco and robaxin can cause drowsiness, do not take before driving.   You may also use ice and heat, and over-the-counter remedies such as Biofreeze gel or salon pas lidocaine patches. The muscle soreness  and bruising should improve over the next week. Follow up with your family doctor in the next week for a recheck if you are still having symptoms. Return to ED if pain is worsening, you develop weakness or numbness of extremities, or new or concerning symptoms develop.
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
## 2021-01-03 ENCOUNTER — Other Ambulatory Visit: Payer: Self-pay | Admitting: Internal Medicine

## 2021-01-03 DIAGNOSIS — N644 Mastodynia: Secondary | ICD-10-CM

## 2021-04-28 ENCOUNTER — Emergency Department (HOSPITAL_BASED_OUTPATIENT_CLINIC_OR_DEPARTMENT_OTHER)
Admission: EM | Admit: 2021-04-28 | Discharge: 2021-04-28 | Disposition: A | Discharge status: Home / Self Care | Payer: Medicare Other | Attending: Emergency Medicine | Admitting: Emergency Medicine

## 2021-04-28 ENCOUNTER — Encounter (HOSPITAL_BASED_OUTPATIENT_CLINIC_OR_DEPARTMENT_OTHER): Payer: Self-pay | Admitting: Emergency Medicine

## 2021-04-28 ENCOUNTER — Emergency Department (HOSPITAL_BASED_OUTPATIENT_CLINIC_OR_DEPARTMENT_OTHER): Payer: Medicare Other

## 2021-04-28 ENCOUNTER — Other Ambulatory Visit: Payer: Self-pay

## 2021-04-28 DIAGNOSIS — Z79899 Other long term (current) drug therapy: Secondary | ICD-10-CM | POA: Insufficient documentation

## 2021-04-28 DIAGNOSIS — M25532 Pain in left wrist: Secondary | ICD-10-CM | POA: Diagnosis present

## 2021-04-28 DIAGNOSIS — I10 Essential (primary) hypertension: Secondary | ICD-10-CM | POA: Insufficient documentation

## 2021-04-28 DIAGNOSIS — R519 Headache, unspecified: Secondary | ICD-10-CM | POA: Insufficient documentation

## 2021-04-28 DIAGNOSIS — R112 Nausea with vomiting, unspecified: Secondary | ICD-10-CM | POA: Insufficient documentation

## 2021-04-28 DIAGNOSIS — M791 Myalgia, unspecified site: Secondary | ICD-10-CM | POA: Insufficient documentation

## 2021-04-28 LAB — BASIC METABOLIC PANEL
Anion gap: 9 (ref 5–15)
BUN: 17 mg/dL (ref 8–23)
CO2: 25 mmol/L (ref 22–32)
Calcium: 9 mg/dL (ref 8.9–10.3)
Chloride: 103 mmol/L (ref 98–111)
Creatinine, Ser: 0.87 mg/dL (ref 0.44–1.00)
GFR, Estimated: >60 mL/min (ref >60)
Glucose, Bld: 95 mg/dL (ref 70–99)
Potassium: 3.8 mmol/L (ref 3.5–5.1)
Sodium: 137 mmol/L (ref 135–145)

## 2021-04-28 LAB — CBC WITH DIFFERENTIAL/PLATELET
Abs Immature Granulocytes: 0.01 10*3/uL (ref 0.00–0.07)
Basophils Absolute: 0 10*3/uL (ref 0.0–0.1)
Basophils Relative: 0 %
Eosinophils Absolute: 0.1 10*3/uL (ref 0.0–0.5)
Eosinophils Relative: 1 %
HCT: 40.3 % (ref 36.0–46.0)
Hemoglobin: 13.5 g/dL (ref 12.0–15.0)
Immature Granulocytes: 0 %
Lymphocytes Relative: 31 %
Lymphs Abs: 2.1 10*3/uL (ref 0.7–4.0)
MCH: 29.5 pg (ref 26.0–34.0)
MCHC: 33.5 g/dL (ref 30.0–36.0)
MCV: 88.2 fL (ref 80.0–100.0)
Monocytes Absolute: 0.5 10*3/uL (ref 0.1–1.0)
Monocytes Relative: 8 %
Neutro Abs: 4.1 10*3/uL (ref 1.7–7.7)
Neutrophils Relative %: 60 %
Platelets: 195 10*3/uL (ref 150–400)
RBC: 4.57 MIL/uL (ref 3.87–5.11)
RDW: 13.8 % (ref 11.5–15.5)
WBC: 6.8 10*3/uL (ref 4.0–10.5)
nRBC: 0 % (ref 0.0–0.2)

## 2021-04-28 LAB — SEDIMENTATION RATE: Sed Rate: 20 mm/hr (ref 0–22)

## 2021-04-28 LAB — C-REACTIVE PROTEIN: CRP: 0.6 mg/dL (ref <1.0)

## 2021-04-28 MED ORDER — SODIUM CHLORIDE 0.9 % IV BOLUS
1000.0000 mL | Freq: Once | INTRAVENOUS | Status: AC
  Administered 2021-04-28: 1000 mL via INTRAVENOUS

## 2021-04-28 MED ORDER — DIPHENHYDRAMINE HCL 50 MG/ML IJ SOLN
25.0000 mg | Freq: Once | INTRAMUSCULAR | Status: AC
  Administered 2021-04-28: 25 mg via INTRAVENOUS
  Filled 2021-04-28: qty 1

## 2021-04-28 MED ORDER — ONDANSETRON 4 MG PO TBDP: ORAL_TABLET | ORAL | 0 refills | Status: DC

## 2021-04-28 MED ORDER — OXYCODONE HCL 5 MG PO TABS
5.0000 mg | ORAL_TABLET | Freq: Once | ORAL | Status: AC
  Administered 2021-04-28: 5 mg via ORAL
  Filled 2021-04-28: qty 1

## 2021-04-28 MED ORDER — PROCHLORPERAZINE EDISYLATE 10 MG/2ML IJ SOLN
10.0000 mg | Freq: Once | INTRAMUSCULAR | Status: AC
  Administered 2021-04-28: 10 mg via INTRAVENOUS
  Filled 2021-04-28: qty 2

## 2021-04-28 MED ORDER — KETOROLAC TROMETHAMINE 15 MG/ML IJ SOLN
15.0000 mg | Freq: Once | INTRAMUSCULAR | Status: AC
  Administered 2021-04-28: 15 mg via INTRAVENOUS
  Filled 2021-04-28: qty 1

## 2021-04-28 MED ORDER — ACETAMINOPHEN 500 MG PO TABS
1000.0000 mg | ORAL_TABLET | Freq: Once | ORAL | Status: AC
  Administered 2021-04-28: 1000 mg via ORAL
  Filled 2021-04-28: qty 2

## 2021-04-28 NOTE — ED Triage Notes (Signed)
Pt's visitor states pt started having left arm pain and numbness yesterday and tonight about 2 or 3 am she c/o headache with nausea and vomiting  Pt is holding her left arm

## 2021-04-28 NOTE — ED Notes (Signed)
ED Provider at bedside. 

## 2021-04-28 NOTE — Discharge Instructions (Signed)
Return for redness, fever, inability to eat or drink  Take 4 over the counter ibuprofen tablets 3 times a day or 2 over-the-counter naproxen tablets twice a day for pain. Also take tylenol 1000mg (2 extra strength) four times a day.

## 2021-04-28 NOTE — ED Provider Notes (Signed)
[facility] DEPART[facility] Provider Note   CSN: [id number] Arrival date & time: 04/28/21  6962     History Chief Complaint  Patient presents with   Headache   Arm Pain    Melissa Jones is a 65 y.o. female.  65 yo F with a chief complaints of left wrist pain.  This been getting worse since yesterday.  Pain was so bad that she was unable to go to sleep and then she started having nausea vomiting and a headache due to the discomfort.  She feels like the pain starts on the ulnar aspect of the wrist and radiates up the arm to the neck.  She denies any injury to the wrist denies injury to the head or the neck.  Worse with movement and palpation and supination and pronation.  The history is provided by the patient and the spouse.  Headache Associated symptoms: myalgias, nausea and vomiting   Associated symptoms: no congestion, no dizziness and no fever   Arm Pain Associated symptoms include headaches. Pertinent negatives include no chest pain and no shortness of breath.  Illness Severity:  Moderate Onset quality:  Gradual Duration:  1 day Timing:  Constant Progression:  Worsening Chronicity:  New Associated symptoms: headaches, myalgias, nausea and vomiting   Associated symptoms: no chest pain, no congestion, no fever, no rhinorrhea, no shortness of breath and no wheezing       Past Medical History:  Diagnosis Date   Abnormal Pap smear of vagina    Chronic back pain    Depression    Takes Zoloft occas., no regular schedule   Hyperlipidemia    Hypertension     Patient Active Problem List   Diagnosis Date Noted   BACK PAIN 10/11/2010   CARPAL TUNNEL SYNDROME, BILATERAL 10/08/2009   SHOULDER PAIN, LEFT 02/18/2009   ABDOMINAL PAIN, LEFT LOWER QUADRANT 11/04/2008   DEPRESSION, ACUTE, RECURRENT 08/16/2008   MYALGIA 08/04/2008   PRIMARY HYPERPARATHYROIDISM 04/14/2008   HYPERCALCEMIA 03/18/2008   Lumbago 03/18/2008   HEADACHE, REBOUND 12/04/2007    PAP SMEAR, ABNORMAL, ASCUS 08/09/2007   HYPERTENSION 07/23/2007   CHEST PAIN, ATYPICAL 07/23/2007   HYPERLIPIDEMIA 07/19/2007   ESSENTIAL HYPERTENSION, BENIGN 07/19/2007   ASCUS (atypical squamous cells of undetermined significance) on Pap smear 07/16/2007    Past Surgical History:  Procedure Laterality Date   COLPOSCOPY     THYROID CYST EXCISION     TUBAL LIGATION       OB History     Gravida  5   Para  4   Term  4   Preterm  0   AB  1   Living  4      SAB  0   IAB  1   Ectopic  0   Multiple  0   Live Births              Family History  Problem Relation Age of Onset   Sinusitis Mother    Hypertension Mother    Hypertension Father    Depression Sister    Depression Sister     Social History   Tobacco Use   Smoking status: Never   Smokeless tobacco: Never  Vaping Use   Vaping Use: Never used  Substance Use Topics   Alcohol use: No   Drug use: No    Home Medications Prior to Admission medications   Medication Sig Start Date End Date Taking? Authorizing Provider  acetaminophen (TYLENOL) 500 MG tablet Take 1,000  mg by mouth every 6 (six) hours as needed. For pain    [provider]  atorvastatin (LIPITOR) 40 MG tablet Take 40 mg by mouth daily.    [provider]  Calcium Carb-Cholecalciferol 600-800 MG-UNIT CHEW Chew 1 tablet by mouth 2 (two) times daily. 12/13/17   [provider]  cyclobenzaprine (FLEXERIL) 10 MG tablet Take 10 mg by mouth 3 (three) times daily as needed for muscle spasms.     [provider]  dicyclomine (BENTYL) 20 MG tablet Take 1 tablet (20 mg total) by mouth 2 (two) times daily as needed (Abdominal cramping, spasms). 06/24/16   Ward, Ozella Almond, PA-C  gabapentin (NEURONTIN) 300 MG capsule Take 1 capsule (300 mg total) by mouth 2 (two) times daily. Patient taking differently: Take 300 mg by mouth at bedtime.  05/21/16   Horton, Barbette Hair, MD  HYDROcodone-acetaminophen (NORCO) 5-325  MG tablet Take 1 tablet by mouth every 6 (six) hours as needed. 04/28/20   Jacqlyn Larsen, PA-C  hydrocortisone (ANUSOL-HC) 2.5 % rectal cream Place 1 application rectally 2 (two) times daily. Patient not taking: Reported on 10/06/2016 05/11/16   Julianne Handler, CNM  lisinopril (PRINIVIL,ZESTRIL) 10 MG tablet Take 20 mg by mouth daily.     [provider]  loratadine (CLARITIN) 10 MG tablet Take 10 mg by mouth daily.    [provider]  meloxicam (MOBIC) 15 MG tablet Take 15 mg by mouth daily. 06/08/16   [provider]  methocarbamol (ROBAXIN) 500 MG tablet Take 1 tablet (500 mg total) by mouth 2 (two) times daily. 04/28/20   Jacqlyn Larsen, PA-C  ondansetron (ZOFRAN ODT) 4 MG disintegrating tablet Take 1 tablet (4 mg total) by mouth every 8 (eight) hours as needed for nausea or vomiting. 10/23/17   Recardo Evangelist, PA-C  ranitidine (ZANTAC) 300 MG tablet Take 300 mg by mouth daily as needed for heartburn.     [provider]  sertraline (ZOLOFT) 25 MG tablet Take 25 mg by mouth daily.    [provider]    Allergies    Shellfish-derived products, Iodine, and Morphine and related  Review of Systems   Review of Systems  Constitutional:  Negative for chills and fever.  HENT:  Negative for congestion and rhinorrhea.   Eyes:  Negative for redness and visual disturbance.  Respiratory:  Negative for shortness of breath and wheezing.   Cardiovascular:  Negative for chest pain and palpitations.  Gastrointestinal:  Positive for nausea and vomiting.  Genitourinary:  Negative for dysuria and urgency.  Musculoskeletal:  Positive for arthralgias and myalgias.  Skin:  Negative for pallor and wound.  Neurological:  Positive for headaches. Negative for dizziness.   Physical Exam Updated Vital Signs BP (!) 174/87 (BP Location: Right Arm)   Pulse 66   Temp 97.7 F (36.5 C) (Oral)   Resp 16   Ht 5\' 2"  (1.575 m)   Wt 59 kg   SpO2 99%   BMI 23.78 kg/m    Physical Exam Vitals and nursing note reviewed.  Constitutional:      General: She is not in acute distress.    Appearance: She is well-developed. She is not diaphoretic.  HENT:     Head: Normocephalic and atraumatic.  Eyes:     Pupils: Pupils are equal, round, and reactive to light.  Cardiovascular:     Rate and Rhythm: Normal rate and regular rhythm.     Heart sounds: No murmur heard.  No friction rub. No gallop.  Pulmonary:     Effort: Pulmonary effort is normal.     Breath sounds: No wheezing or rales.  Abdominal:     General: There is no distension.     Palpations: Abdomen is soft.     Tenderness: There is no abdominal tenderness.  Musculoskeletal:     Right wrist: Tenderness present. No swelling or deformity.     Left wrist: No swelling or deformity.     Cervical back: Normal range of motion and neck supple.     Comments: Pain along the soft tissue proximal to the wrist more tender along the ulnar aspect.  No obvious edema no erythema or warmth.  I am able to range the wrist without significant issue but has pain with extension of the fingers and passive extension of the hand past 90 degrees.  Pain along the soft tissue up to the trapezius and the supraclavicular space.  No obvious erythema or edema.  Able to range the shoulder without significant tenderness.  No pain with range of motion of the left elbow.  Skin:    General: Skin is warm and dry.  Neurological:     Mental Status: She is alert and oriented to person, place, and time.  Psychiatric:        Behavior: Behavior normal.    ED Results / Procedures / Treatments   Labs (all labs ordered are listed, but only abnormal results are displayed) Labs Reviewed  CBC WITH DIFFERENTIAL/PLATELET  BASIC METABOLIC PANEL  SEDIMENTATION RATE  C-REACTIVE PROTEIN    EKG EKG Interpretation  Date/Time:  Thursday April 28 2021 06:12:29 EDT Ventricular Rate:  60 PR Interval:  143 QRS Duration: 90 QT  Interval:  400 QTC Calculation: 400 R Axis:   51 Text Interpretation: Sinus rhythm No significant change since last tracing Confirmed by Deno Etienne [phone]) on 04/28/2021 6:14:25 AM  Radiology DG Wrist Complete Left  Result Date: 04/28/2021 CLINICAL DATA:  Left wrist pain EXAM: LEFT WRIST - COMPLETE 3+ VIEW COMPARISON:  None. FINDINGS: There is no evidence of fracture or dislocation. There is no evidence of arthropathy or other focal bone abnormality. Soft tissues are unremarkable. IMPRESSION: Negative. Electronically Signed   By: Jorje Guild M.D.   On: 04/28/2021 06:52    Procedures Procedures   Medications Ordered in ED Medications  ketorolac (TORADOL) 15 MG/ML injection 15 mg (has no administration in time range)  sodium chloride 0.9 % bolus 1,000 mL (1,000 mLs Intravenous New Bag/Given 04/28/21 6812)  prochlorperazine (COMPAZINE) injection 10 mg (10 mg Intravenous Given 04/28/21 0615)  diphenhydrAMINE (BENADRYL) injection 25 mg (25 mg Intravenous Given 04/28/21 7517)  acetaminophen (TYLENOL) tablet 1,000 mg (1,000 mg Oral Given 04/28/21 0617)  oxyCODONE (Oxy IR/ROXICODONE) immediate release tablet 5 mg (5 mg Oral Given 04/28/21 0017)    ED Course  I have reviewed the triage vital signs and the nursing notes.  Pertinent labs & imaging results that were available during my care of the patient were reviewed by me and considered in my medical decision making (see chart for details).    MDM Rules/Calculators/A&P                           65 yo F with a chief complaints of left wrist pain.  The patient is presenting somewhat bizarrely, I have trouble explaining why she would have so much discomfort enough that would make her have nausea and vomiting.  We will obtain a screening x-ray to evaluate for fracture or free air concerning for necrotizing fasciitis.  She has intact pulses distally and cap refills less than 2 seconds acute arterial occlusion seems unlikely.  She has no edema and so  I feel acute DVT is also unlikely.  She has more pain focally in the wrist than proximally and making a cervical nerve impingement less likely.  We will treat the patient's pain and headache.  Reassess.  Patient is feeling a bit better, tolerating p.o. without issue.  Plain film viewed by me without fracture or subcutaneous air.  No obvious edema in the joint on x-ray.  Will splint for the patient's comfort.  Have her follow-up with her family doctor in the office.  Lab work without leukocytosis or significant electrolyte abnormality.  7:00 AM:  I have discussed the diagnosis/risks/treatment options with the patient and family and believe the pt to be eligible for discharge home to follow-up with PCP. We also discussed returning to the ED immediately if new or worsening sx occur. We discussed the sx which are most concerning (e.g., sudden worsening pain, fever, inability to tolerate by mouth) that necessitate immediate return. Medications administered to the patient during their visit and any new prescriptions provided to the patient are listed below.  Medications given during this visit Medications  ketorolac (TORADOL) 15 MG/ML injection 15 mg (has no administration in time range)  sodium chloride 0.9 % bolus 1,000 mL (1,000 mLs Intravenous New Bag/Given 04/28/21 1610)  prochlorperazine (COMPAZINE) injection 10 mg (10 mg Intravenous Given 04/28/21 0615)  diphenhydrAMINE (BENADRYL) injection 25 mg (25 mg Intravenous Given 04/28/21 9604)  acetaminophen (TYLENOL) tablet 1,000 mg (1,000 mg Oral Given 04/28/21 0617)  oxyCODONE (Oxy IR/ROXICODONE) immediate release tablet 5 mg (5 mg Oral Given 04/28/21 0618)     The patient appears reasonably screen and/or stabilized for discharge and I doubt any other medical condition or other [facility] requiring further screening, evaluation, or treatment in the ED at this time prior to discharge.   Final Clinical Impression(s) / ED Diagnoses Final diagnoses:  Left wrist  pain    Rx / DC Orders ED Discharge Orders     None        Deno Etienne, DO 04/28/21 0700

## 2021-04-28 NOTE — ED Notes (Signed)
Returned from xray

## 2021-10-16 ENCOUNTER — Other Ambulatory Visit: Payer: Self-pay

## 2021-10-16 ENCOUNTER — Emergency Department (HOSPITAL_COMMUNITY): Payer: Medicare Other

## 2021-10-16 ENCOUNTER — Encounter (HOSPITAL_COMMUNITY): Payer: Self-pay | Admitting: Emergency Medicine

## 2021-10-16 ENCOUNTER — Emergency Department (HOSPITAL_COMMUNITY)
Admission: EM | Admit: 2021-10-16 | Discharge: 2021-10-16 | Disposition: A | Discharge status: Home / Self Care | Payer: Medicare Other | Attending: Emergency Medicine | Admitting: Emergency Medicine

## 2021-10-16 DIAGNOSIS — I10 Essential (primary) hypertension: Secondary | ICD-10-CM | POA: Diagnosis not present

## 2021-10-16 DIAGNOSIS — R1032 Left lower quadrant pain: Secondary | ICD-10-CM | POA: Diagnosis present

## 2021-10-16 DIAGNOSIS — Z79899 Other long term (current) drug therapy: Secondary | ICD-10-CM | POA: Diagnosis not present

## 2021-10-16 DIAGNOSIS — K5792 Diverticulitis of intestine, part unspecified, without perforation or abscess without bleeding: Secondary | ICD-10-CM | POA: Diagnosis not present

## 2021-10-16 LAB — URINALYSIS, ROUTINE W REFLEX MICROSCOPIC
Bilirubin Urine: NEGATIVE
Glucose, UA: NEGATIVE mg/dL
Hgb urine dipstick: NEGATIVE
Ketones, ur: NEGATIVE mg/dL
Leukocytes,Ua: NEGATIVE
Nitrite: NEGATIVE
Protein, ur: NEGATIVE mg/dL
Specific Gravity, Urine: 1.01 (ref 1.005–1.030)
pH: 7 (ref 5.0–8.0)

## 2021-10-16 LAB — DIFFERENTIAL
Abs Immature Granulocytes: 0.02 10*3/uL (ref 0.00–0.07)
Basophils Absolute: 0 10*3/uL (ref 0.0–0.1)
Basophils Relative: 1 %
Eosinophils Absolute: 0 10*3/uL (ref 0.0–0.5)
Eosinophils Relative: 1 %
Immature Granulocytes: 0 %
Lymphocytes Relative: 37 %
Lymphs Abs: 2.8 10*3/uL (ref 0.7–4.0)
Monocytes Absolute: 0.6 10*3/uL (ref 0.1–1.0)
Monocytes Relative: 8 %
Neutro Abs: 4.1 10*3/uL (ref 1.7–7.7)
Neutrophils Relative %: 53 %

## 2021-10-16 LAB — CBC
HCT: 41.1 % (ref 36.0–46.0)
Hemoglobin: 13.5 g/dL (ref 12.0–15.0)
MCH: 29.9 pg (ref 26.0–34.0)
MCHC: 32.8 g/dL (ref 30.0–36.0)
MCV: 90.9 fL (ref 80.0–100.0)
Platelets: 226 10*3/uL (ref 150–400)
RBC: 4.52 MIL/uL (ref 3.87–5.11)
RDW: 13.8 % (ref 11.5–15.5)
WBC: 7.5 10*3/uL (ref 4.0–10.5)
nRBC: 0 % (ref 0.0–0.2)

## 2021-10-16 LAB — COMPREHENSIVE METABOLIC PANEL
ALT: 19 U/L (ref 0–44)
AST: 19 U/L (ref 15–41)
Albumin: 3.8 g/dL (ref 3.5–5.0)
Alkaline Phosphatase: 34 U/L — ABNORMAL LOW (ref 38–126)
Anion gap: 8 (ref 5–15)
BUN: 7 mg/dL — ABNORMAL LOW (ref 8–23)
CO2: 27 mmol/L (ref 22–32)
Calcium: 9.1 mg/dL (ref 8.9–10.3)
Chloride: 103 mmol/L (ref 98–111)
Creatinine, Ser: 0.76 mg/dL (ref 0.44–1.00)
GFR, Estimated: >60 mL/min (ref >60)
Glucose, Bld: 90 mg/dL (ref 70–99)
Potassium: 3.9 mmol/L (ref 3.5–5.1)
Sodium: 138 mmol/L (ref 135–145)
Total Bilirubin: 0.4 mg/dL (ref 0.3–1.2)
Total Protein: 7.6 g/dL (ref 6.5–8.1)

## 2021-10-16 LAB — LIPASE, BLOOD: Lipase: 33 U/L (ref 11–51)

## 2021-10-16 LAB — TYPE AND SCREEN
ABO/RH(D): O POS
Antibody Screen: NEGATIVE

## 2021-10-16 MED ORDER — FENTANYL CITRATE PF 50 MCG/ML IJ SOSY
50.0000 ug | PREFILLED_SYRINGE | Freq: Once | INTRAMUSCULAR | Status: AC
  Administered 2021-10-16: 50 ug via INTRAVENOUS
  Filled 2021-10-16: qty 1

## 2021-10-16 MED ORDER — SODIUM CHLORIDE 0.9 % IV BOLUS
1000.0000 mL | Freq: Once | INTRAVENOUS | Status: AC
  Administered 2021-10-16: 1000 mL via INTRAVENOUS

## 2021-10-16 MED ORDER — AMOXICILLIN-POT CLAVULANATE 875-125 MG PO TABS: 1.0000 | ORAL_TABLET | Freq: Three times a day (TID) | ORAL | 0 refills | Status: AC

## 2021-10-16 MED ORDER — ONDANSETRON HCL 4 MG/2ML IJ SOLN
4.0000 mg | Freq: Once | INTRAMUSCULAR | Status: AC
  Administered 2021-10-16: 4 mg via INTRAVENOUS
  Filled 2021-10-16: qty 2
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
## 2021-10-16 NOTE — ED Notes (Signed)
Pt reports abdominal pain that has increased over 2 months. Pt has seen PCP with a CT scan and they told her to come for a MRI due to being allergic to CT contrast. Pt was also referred to GI and has not heard back from them after her appointment. Pt AOx4 and VSS. Pt reports nausea and diarrhea for 2 months and nothing helps with the pain or symptoms. Pt report some bright red blood in her stool sometimes.  ?

## 2021-10-16 NOTE — Discharge Instructions (Addendum)
You have been diagnosed with a mild case of diverticulitis which is inflammation of your intestines. The treatment for this is a clear liquid diet for the next 5 days. Instructions for diet are included in your paperwork. I have also prescribed you an antibiotic that should improve your symptoms. I have also provided you a referral to gastroenterology. Please call them to set up an appointment. You should also follow up with your PCP. ?Please return here if your symptoms do not improve or if they worsen.  ?

## 2021-10-16 NOTE — ED Provider Notes (Signed)
?[facility] ?Provider Note ? ? ?CSN: [id number] ?Arrival date & time: 10/16/21  0909 ? ?  ? ?History ?PMH: HTN, HLD ?Chief Complaint  ?Patient presents with  ? Abdominal Pain  ? ? ?Melissa Jones is a 66 y.o. female.  Patient presents the emergency department with chief complaint of left lower quadrant abdominal pain.  She has been having this pain intermittently for 2 months now.  She says it occurs every single day.  It is not always in the same spot the left lower quadrant.  She says is associated with diarrhea and nausea.  She says she woke up today and the pain was much worse than normal and so that is why she decided to come to the emergency department.  She has had 1 episode of diarrhea today and has had continuous nausea.  She also feels lightheaded and generally weak.  She also endorses a history of rectal bleeding for several years.  She says that it is bright red and is sometimes present in the stool and sometimes when she wipes.  She does not know if this is associated with the left lower quadrant pain or diarrhea. She has been evaluated by gastroenterology with intentions of doing a colonoscopy, however she says that this is not scheduled for 7 months.  She states that she has been followed by her primary care doctor for the same issue and she has had a CT scan done about a month and a half ago.  She said that it showed a "spot" and that they were intending on doing an MRI, however there is an issue with her being allergic to gadolinium.  Right now, she denies any chest pain, shortness of breath, constipation, headache, fever, chills, dysuria, hematuria, or vaginal discharge. ? ? ?Abdominal Pain ?Associated symptoms: diarrhea and nausea   ? ?  ? ?Home Medications ?Prior to Admission medications   ?Medication Sig Start Date End Date Taking? Authorizing Provider  ?acetaminophen (TYLENOL) 500 MG tablet Take 1,000 mg by mouth every 6 (six) hours as needed. For  pain   Yes [provider]  ?amoxicillin-clavulanate (AUGMENTIN) 875-125 MG tablet Take 1 tablet by mouth every 8 (eight) hours for 5 days. 10/16/21 10/21/21 Yes Haleema Vanderheyden, Adora Fridge, PA-C  ?Calcium Carb-Cholecalciferol 600-800 MG-UNIT CHEW Chew 1 tablet by mouth 2 (two) times daily. 12/13/17  Yes [provider]  ?cyclobenzaprine (FLEXERIL) 10 MG tablet Take 10 mg by mouth 3 (three) times daily as needed for muscle spasms.    Yes [provider]  ?gabapentin (NEURONTIN) 300 MG capsule Take 1 capsule (300 mg total) by mouth 2 (two) times daily. ?Patient taking differently: Take 300 mg by mouth at bedtime as needed (nerve pain). 05/21/16  Yes Horton, Barbette Hair, MD  ?hydrocortisone 2.5 % cream Apply 1 application. topically 2 (two) times daily as needed for rash or itching. 09/30/21  Yes [provider]  ?lisinopril (PRINIVIL,ZESTRIL) 10 MG tablet Take 10 mg by mouth daily.   Yes [provider]  ?meloxicam (MOBIC) 15 MG tablet Take 15 mg by mouth daily. 06/08/16  Yes [provider]  ?omeprazole (PRILOSEC) 40 MG capsule Take 40 mg by mouth daily before breakfast.   Yes [provider]  ?rosuvastatin (CRESTOR) 20 MG tablet Take 20 mg by mouth at bedtime. 05/05/21  Yes [provider]  ?   ? ?Allergies    ?Shellfish-derived products, Iodine, and Morphine and related   ? ?Review of Systems   ?  Review of Systems  ?Gastrointestinal:  Positive for abdominal pain, anal bleeding, diarrhea and nausea.  ?All other systems reviewed and are negative. ? ?Physical Exam ?Updated Vital Signs ?BP 119/72   Pulse 64   Temp 97.7 ?F (36.5 ?C) (Oral)   Resp 12   SpO2 100%  ?Physical Exam ?Vitals and nursing note reviewed.  ?Constitutional:   ?   General: She is not in acute distress. ?   Appearance: Normal appearance. She is not ill-appearing, toxic-appearing or diaphoretic.  ?HENT:  ?   Head: Normocephalic and atraumatic.  ?   Nose: No nasal deformity.  ?   Mouth/Throat:   ?   Lips: Pink. No lesions.  ?   Mouth: Mucous membranes are moist. No injury, lacerations, oral lesions or angioedema.  ?   Pharynx: Oropharynx is clear. Uvula midline. No pharyngeal swelling, oropharyngeal exudate, posterior oropharyngeal erythema or uvula swelling.  ?Eyes:  ?   General: Gaze aligned appropriately. No scleral icterus.    ?   Right eye: No discharge.     ?   Left eye: No discharge.  ?   Conjunctiva/sclera: Conjunctivae normal.  ?   Right eye: Right conjunctiva is not injected. No exudate or hemorrhage. ?   Left eye: Left conjunctiva is not injected. No exudate or hemorrhage. ?   Pupils: Pupils are equal, round, and reactive to light.  ?Cardiovascular:  ?   Rate and Rhythm: Normal rate and regular rhythm.  ?   Pulses: Normal pulses.     ?     Radial pulses are 2+ on the right side and 2+ on the left side.  ?     Dorsalis pedis pulses are 2+ on the right side and 2+ on the left side.  ?   Heart sounds: Normal heart sounds, S1 normal and S2 normal. Heart sounds not distant. No murmur heard. ?  No friction rub. No gallop. No S3 or S4 sounds.  ?Pulmonary:  ?   Effort: Pulmonary effort is normal. No accessory muscle usage or respiratory distress.  ?   Breath sounds: Normal breath sounds. No stridor. No wheezing, rhonchi or rales.  ?Chest:  ?   Chest wall: No tenderness.  ?Abdominal:  ?   General: Abdomen is flat. Bowel sounds are normal. There is no distension.  ?   Palpations: Abdomen is soft. There is no mass or pulsatile mass.  ?   Tenderness: There is abdominal tenderness in the left lower quadrant. There is left CVA tenderness, guarding and rebound. There is no right CVA tenderness. Negative signs include Murphy's sign and McBurney's sign.  ?   Hernia: No hernia is present.  ?Musculoskeletal:  ?   Right lower leg: No edema.  ?   Left lower leg: No edema.  ?Skin: ?   General: Skin is warm and dry.  ?   Coloration: Skin is not jaundiced or pale.  ?   Findings: No bruising, erythema, lesion or rash.   ?Neurological:  ?   General: No focal deficit present.  ?   Mental Status: She is alert and oriented to person, place, and time.  ?   GCS: GCS eye subscore is 4. GCS verbal subscore is 5. GCS motor subscore is 6.  ?Psychiatric:     ?   Mood and Affect: Mood normal.     ?   Behavior: Behavior normal. Behavior is cooperative.  ? ? ?ED Results / Procedures / Treatments   ?Labs ?(all labs ordered  are listed, but only abnormal results are displayed) ?Labs Reviewed  ?COMPREHENSIVE METABOLIC PANEL - Abnormal; Notable for the following components:  ?    Result Value  ? BUN 7 (*)   ? Alkaline Phosphatase 34 (*)   ? All other components within normal limits  ?URINALYSIS, ROUTINE W REFLEX MICROSCOPIC - Abnormal; Notable for the following components:  ? Color, Urine STRAW (*)   ? All other components within normal limits  ?LIPASE, BLOOD  ?CBC  ?DIFFERENTIAL  ?CBC WITH DIFFERENTIAL/PLATELET  ?TYPE AND SCREEN  ? ? ?EKG ?None ? ?Radiology ?CT ABDOMEN PELVIS WO CONTRAST ? ?Result Date: 10/16/2021 ?CLINICAL DATA:  Left lower quadrant abdominal pain and diarrhea. EXAM: CT ABDOMEN AND PELVIS WITHOUT CONTRAST TECHNIQUE: Multidetector CT imaging of the abdomen and pelvis was performed following the standard protocol without IV contrast. RADIATION DOSE REDUCTION: This exam was performed according to the departmental dose-optimization program which includes automated exposure control, adjustment of the mA and/or kV according to patient size and/or use of iterative reconstruction technique. COMPARISON:  CT abdomen pelvis dated April 28, 2020. FINDINGS: Lower chest: No acute abnormality. Hepatobiliary: No focal liver abnormality is seen. No gallstones, gallbladder wall thickening, or biliary dilatation. Pancreas: Unremarkable. No pancreatic ductal dilatation or surrounding inflammatory changes. Spleen: Normal in size without focal abnormality. Adrenals/Urinary Tract: Adrenal glands are unremarkable. Kidneys are normal, without renal  calculi, focal lesion, or hydronephrosis. Bladder is unremarkable. Stomach/Bowel: Stomach is within normal limits. Appendix appears normal. Mild sigmoid colonic diverticulosis. Focal mild fat stranding inferior

## 2021-11-09 ENCOUNTER — Ambulatory Visit (INDEPENDENT_AMBULATORY_CARE_PROVIDER_SITE_OTHER): Payer: Medicare Other | Admitting: Family Medicine

## 2021-11-09 ENCOUNTER — Encounter: Payer: Self-pay | Admitting: Family Medicine

## 2021-11-09 VITALS — BP 132/77 | HR 71

## 2021-11-09 DIAGNOSIS — Z124 Encounter for screening for malignant neoplasm of cervix: Secondary | ICD-10-CM | POA: Diagnosis not present

## 2021-11-09 NOTE — Assessment & Plan Note (Signed)
Discussed with patient that she is not yet due for pap smear, although could obtain today and have it be her last or wait until next year when it has been five years. Patient much prefers to defer to next year. Reminder future message placed for front desk to schedule for 01/2023.  ?

## 2021-11-09 NOTE — Progress Notes (Signed)
? ?GYNECOLOGY OFFICE VISIT NOTE ? ?History:  ? Melissa Jones is a 66 y.o. [id number] here today for repeat pap smear. ? ?On review of pap history she has had normal pap smears for the past ten years ?Last had NILM with neg HPV 01/2018 ? ?Following w [facility] for mammogram ?BIRADS 3 08/2021, plan for repeat next month already scheduled ? ?Health Maintenance Due  ?Topic Date Due  ? HIV Screening  Never done  ? Hepatitis C Screening  Never done  ? TETANUS/TDAP  Never done  ? COLONOSCOPY (Pts 45-55yr Insurance coverage will need to be confirmed)  Never done  ? Zoster Vaccines- Shingrix (1 of 2) Never done  ? COVID-19 Vaccine (3 - Booster for Pfizer series) 06/10/2020  ? PAP SMEAR-Modifier  01/30/2021  ? Pneumonia Vaccine 66 Years old (1 - PCV) Never done  ? DEXA SCAN  Never done  ? ? ?Past Medical History:  ?Diagnosis Date  ? Abnormal Pap smear of vagina   ? Chronic back pain   ? Depression   ? Takes Zoloft occas., no regular schedule  ? Hyperlipidemia   ? Hypertension   ? ? ?Past Surgical History:  ?Procedure Laterality Date  ? COLPOSCOPY    ? THYROID CYST EXCISION    ? TUBAL LIGATION    ? ? ?The following portions of the patient's history were reviewed and updated as appropriate: allergies, current medications, past family history, past medical history, past social history, past surgical history and problem list.  ? ?Health Maintenance:   ?Last pap: ?Lab Results  ?Component Value Date  ? DIAGPAP  01/30/2018  ?  NEGATIVE FOR INTRAEPITHELIAL LESIONS OR MALIGNANCY. BENIGN REACTIVE/REPARATIVE CHANGES.  ? HPV NOT DETECTED 01/30/2018  ? ? ?Last mammogram:  ?BIRADS 3 08/2021, has repeat scheduled with [facility]   ? ?Review of Systems:  ?Pertinent items noted in HPI and remainder of comprehensive ROS otherwise negative. ? ?Physical Exam:  ?BP 132/77   Pulse 71  ?CONSTITUTIONAL: Well-developed, well-nourished female in no acute distress.  ?HEENT:  Normocephalic, atraumatic. External right and left ear normal. No scleral  icterus.  ?NECK: Normal range of motion, supple, no masses noted on observation ?SKIN: No rash noted. Not diaphoretic. No erythema. No pallor. ?MUSCULOSKELETAL: Normal range of motion. No edema noted. ?NEUROLOGIC: Alert and oriented to person, place, and time. Normal muscle tone coordination.  ?PSYCHIATRIC: Normal mood and affect. Normal behavior. Normal judgment and thought content. ?RESPIRATORY: Effort normal, no problems with respiration noted ? ? ?Labs and Imaging ?No results found for this or any previous visit (from the past 168 hour(s)). ?CT ABDOMEN PELVIS WO CONTRAST ? ?Result Date: 10/16/2021 ?CLINICAL DATA:  Left lower quadrant abdominal pain and diarrhea. EXAM: CT ABDOMEN AND PELVIS WITHOUT CONTRAST TECHNIQUE: Multidetector CT imaging of the abdomen and pelvis was performed following the standard protocol without IV contrast. RADIATION DOSE REDUCTION: This exam was performed according to the departmental dose-optimization program which includes automated exposure control, adjustment of the mA and/or kV according to patient size and/or use of iterative reconstruction technique. COMPARISON:  CT abdomen pelvis dated April 28, 2020. FINDINGS: Lower chest: No acute abnormality. Hepatobiliary: No focal liver abnormality is seen. No gallstones, gallbladder wall thickening, or biliary dilatation. Pancreas: Unremarkable. No pancreatic ductal dilatation or surrounding inflammatory changes. Spleen: Normal in size without focal abnormality. Adrenals/Urinary Tract: Adrenal glands are unremarkable. Kidneys are normal, without renal calculi, focal lesion, or hydronephrosis. Bladder is unremarkable. Stomach/Bowel: Stomach is within normal limits. Appendix appears normal. Mild sigmoid colonic  diverticulosis. Focal mild fat stranding inferior to the mid sigmoid colon (series 6, image 41). No obstruction. Vascular/Lymphatic: Aortic atherosclerosis. No enlarged abdominal or pelvic lymph nodes. Reproductive: Uterus and  bilateral adnexa are unremarkable. Other: No abdominal wall hernia or abnormality. No abdominopelvic ascites. No pneumoperitoneum. Musculoskeletal: No acute or significant osseous findings. IMPRESSION: 1. Focal mild fat stranding inferior to the mid sigmoid colon, suggestive of very mild acute diverticulitis. No perforation or abscess. 2. Aortic Atherosclerosis (ICD10-I70.0). Electronically Signed   By: Titus Dubin M.D.   On: 10/16/2021 12:12      ?Assessment and Plan:  ? ?Problem List Items Addressed This Visit   ? ?  ? Other  ? Screening for cervical cancer  ?  Discussed with patient that she is not yet due for pap smear, although could obtain today and have it be her last or wait until next year when it has been five years. Patient much prefers to defer to next year. Reminder future message placed for front desk to schedule for 01/2023.  ?  ?  ? ?Other Visit Diagnoses   ? ? Cervical cancer screening    -  Primary  ? ?  ? ? ?Routine preventative health maintenance measures emphasized. ?Please refer to After Visit Summary for other counseling recommendations.  ? ?Return in about 15 months (around 02/12/2023) for repeat pap.   ? ?Total face-to-face time with patient: 15 minutes.  Over 50% of encounter was spent on counseling and coordination of care. ? ? ?Clarnce Flock, MD/MPH ?Attending Family Medicine Physician, Faculty Practice ?[facility] ? ?

## 2022-03-27 ENCOUNTER — Encounter (HOSPITAL_COMMUNITY): Payer: Self-pay

## 2022-03-27 ENCOUNTER — Ambulatory Visit (HOSPITAL_COMMUNITY)
Admission: EM | Admit: 2022-03-27 | Discharge: 2022-03-27 | Disposition: A | Discharge status: Home / Self Care | Payer: Medicare Other | Attending: Emergency Medicine | Admitting: Emergency Medicine

## 2022-03-27 DIAGNOSIS — I10 Essential (primary) hypertension: Secondary | ICD-10-CM | POA: Insufficient documentation

## 2022-03-27 DIAGNOSIS — R059 Cough, unspecified: Secondary | ICD-10-CM | POA: Diagnosis not present

## 2022-03-27 DIAGNOSIS — J3489 Other specified disorders of nose and nasal sinuses: Secondary | ICD-10-CM | POA: Diagnosis not present

## 2022-03-27 DIAGNOSIS — J069 Acute upper respiratory infection, unspecified: Secondary | ICD-10-CM | POA: Diagnosis present

## 2022-03-27 DIAGNOSIS — H9209 Otalgia, unspecified ear: Secondary | ICD-10-CM | POA: Diagnosis not present

## 2022-03-27 DIAGNOSIS — Z20822 Contact with and (suspected) exposure to covid-19: Secondary | ICD-10-CM | POA: Insufficient documentation

## 2022-03-27 DIAGNOSIS — M791 Myalgia, unspecified site: Secondary | ICD-10-CM | POA: Diagnosis not present

## 2022-03-27 DIAGNOSIS — R519 Headache, unspecified: Secondary | ICD-10-CM | POA: Diagnosis not present

## 2022-03-27 DIAGNOSIS — E785 Hyperlipidemia, unspecified: Secondary | ICD-10-CM | POA: Diagnosis not present

## 2022-03-27 LAB — SARS CORONAVIRUS 2 (TAT 6-24 HRS): SARS Coronavirus 2: NEGATIVE

## 2022-03-27 MED ORDER — FLUTICASONE PROPIONATE 50 MCG/ACT NA SUSP: 1.0000 | Freq: Two times a day (BID) | NASAL | 0 refills | Status: DC

## 2022-03-27 MED ORDER — AZITHROMYCIN 250 MG PO TABS: 250.0000 mg | ORAL_TABLET | Freq: Every day | ORAL | 0 refills | Status: DC

## 2022-03-27 NOTE — ED Triage Notes (Signed)
Pt is here for fever , cough, watery eyes, nasal drainage, ear pain in both ears , body aches and chills , fatigue x2 days

## 2022-03-27 NOTE — Discharge Instructions (Signed)
Nos comunicaremos con usted si su prueba de COVID es positiva nicamente. Si es positivo se Banker antiviral. Por favor, pngase en cuarentena [employer]. Si su prueba es negativa podr reanudar sus actividades normales. Si su prueba es positiva, contine en cuarentena durante al menos 5 das desde la aparicin de sus sntomas.  Si lo ms probable es que los sntomas negativos an sean causados por un virus y Designer, industrial/product darle tiempo a su cuerpo para combatir la infeccin; sin embargo, si todava tiene sntomas sin signos de mejora para el sbado, puede tomar antibiticos, la azitromicina estar a su disposicin. farmacia para uso  Comience a Event organiser aerosol nasal Flonase todas las maanas y todas las noches para ayudar a [employer] senos nasales, lo que ayudar con la sensacin de plenitud en la cabeza y los odos.  Puede tomar Tylenol y/o ibuprofeno segn sea necesario para reducir la fiebre y Best boy.  Para la tos: 1/2 a 1 cucharadita de miel (puedes diluir la miel en agua u otro lquido). Tambin puedes usar guaifenesina para la tos. Puede utilizar un humidificador para la congestin del pecho y la tos. Si no tienes un humidificador, puedes sentarte en el bao con la ducha de agua caliente abierta.  Para el dolor de garganta: pruebe con grgaras de agua tibia con sal, pastillas de cepacol, aerosol para la garganta, t o agua tibia con limn/miel, paletas heladas o hielo, o medicamentos de venta libre para aliviar el resfriado para Health and safety inspector de Investment banker, operational.  Para la congestin: tome un antihistamnico diario como Zyrtec, Claritin.  Es importante mantenerse hidratado: beba muchos lquidos (agua, gatorade/powerade/pedialyte, jugos o ts) para Consulting civil engineer garganta hidratada y ayudar a Public house manager an ms la irritacin o Health and safety inspector.    We will contact you if your COVID test is positive only.  If positive you will be started on antiviral treatment.  Please  quarantine while you wait for the results.  If your test is negative you may resume normal activities.  If your test is positive please continue to quarantine for at least 5 days from your symptom onset.  If negative symptoms are still most likely caused by a virus and you will need to give your body time to fight the infection off however if you are still having symptoms with no signs of improvement by Saturday you may pick up antibiotic, azithromycin will be at the pharmacy for use  Begin use of Flonase nasal spray every morning and every evening to help clear out your sinuses which will help with the feeling of fullness in your head and your ears   You can take Tylenol and/or Ibuprofen as needed for fever reduction and pain relief.   For cough: honey 1/2 to 1 teaspoon (you can dilute the honey in water or another fluid).  You can also use guaifenesin for cough. You can use a humidifier for chest congestion and cough.  If you don't have a humidifier, you can sit in the bathroom with the hot shower running.      For sore throat: try warm salt water gargles, cepacol lozenges, throat spray, warm tea or water with lemon/honey, popsicles or ice, or OTC cold relief medicine for throat discomfort.   For congestion: take a daily anti-histamine like Zyrtec, Claritin.  It is important to stay hydrated: drink plenty of fluids (water, gatorade/powerade/pedialyte, juices, or teas) to keep your throat moisturized and help further relieve irritation/discomfort.

## 2022-03-27 NOTE — ED Provider Notes (Signed)
[facility]    CSN: [id number] Arrival date & time: 03/27/22  0848      History   Chief Complaint Chief Complaint  Patient presents with   Fever   Sore Throat   Otalgia    HPI Melissa Jones is a 66 y.o. female.   Patient presents with fevers, body aches, nasal congestion, rhinorrhea, sore throat, sinus pain and pressure, bilateral ear fullness, generalized headaches and a mild nonproductive cough for 2 days.  No known sick contacts.  Decreased appetite but tolerating some food and fluids.  Has attempted use of Tylenol, Advil and Mucinex.  History of hyperlipidemia, hypertension .    Past Medical History:  Diagnosis Date   Abnormal Pap smear of vagina    Chronic back pain    Depression    Takes Zoloft occas., no regular schedule   Hyperlipidemia    Hypertension     Patient Active Problem List   Diagnosis Date Noted   BACK PAIN 10/11/2010   CARPAL TUNNEL SYNDROME, BILATERAL 10/08/2009   SHOULDER PAIN, LEFT 02/18/2009   ABDOMINAL PAIN, LEFT LOWER QUADRANT 11/04/2008   DEPRESSION, ACUTE, RECURRENT 08/16/2008   MYALGIA 08/04/2008   PRIMARY HYPERPARATHYROIDISM 04/14/2008   HYPERCALCEMIA 03/18/2008   Lumbago 03/18/2008   HEADACHE, REBOUND 12/04/2007   Screening for cervical cancer 08/09/2007   HYPERTENSION 07/23/2007   CHEST PAIN, ATYPICAL 07/23/2007   HYPERLIPIDEMIA 07/19/2007   ESSENTIAL HYPERTENSION, BENIGN 07/19/2007   ASCUS (atypical squamous cells of undetermined significance) on Pap smear 07/16/2007    Past Surgical History:  Procedure Laterality Date   COLPOSCOPY     THYROID CYST EXCISION     TUBAL LIGATION      OB History     Gravida  5   Para  4   Term  4   Preterm  0   AB  1   Living  4      SAB  0   IAB  1   Ectopic  0   Multiple  0   Live Births               Home Medications    Prior to Admission medications   Medication Sig Start Date End Date Taking? Authorizing Provider  acetaminophen  (TYLENOL) 500 MG tablet Take 1,000 mg by mouth every 6 (six) hours as needed. For pain    [provider]  Calcium Carb-Cholecalciferol 600-800 MG-UNIT CHEW Chew 1 tablet by mouth 2 (two) times daily. 12/13/17   [provider]  clopidogrel (PLAVIX) 75 MG tablet Take 75 mg by mouth daily.    [provider]  cyclobenzaprine (FLEXERIL) 10 MG tablet Take 10 mg by mouth 3 (three) times daily as needed for muscle spasms.  Patient not taking: Reported on 11/09/2021    [provider]  gabapentin (NEURONTIN) 300 MG capsule Take 1 capsule (300 mg total) by mouth 2 (two) times daily. Patient taking differently: Take 300 mg by mouth at bedtime as needed (nerve pain). 05/21/16   Horton, Barbette Hair, MD  hydrocortisone 2.5 % cream Apply 1 application. topically 2 (two) times daily as needed for rash or itching. 09/30/21   [provider]  lisinopril (PRINIVIL,ZESTRIL) 10 MG tablet Take 10 mg by mouth daily.    [provider]  meloxicam (MOBIC) 15 MG tablet Take 15 mg by mouth daily. 06/08/16   [provider]  omeprazole (PRILOSEC) 40 MG capsule Take 40 mg by mouth daily before breakfast. Patient not  taking: Reported on 11/09/2021    [provider]  ondansetron (ZOFRAN-ODT) 4 MG disintegrating tablet Take 4 mg by mouth every 8 (eight) hours as needed for nausea or vomiting.    [provider]  rosuvastatin (CRESTOR) 20 MG tablet Take 20 mg by mouth at bedtime. 05/05/21   [provider]    Family History Family History  Problem Relation Age of Onset   Sinusitis Mother    Hypertension Mother    Hypertension Father    Depression Sister    Depression Sister     Social History Social History   Tobacco Use   Smoking status: Never   Smokeless tobacco: Never  Vaping Use   Vaping Use: Never used  Substance Use Topics   Alcohol use: No   Drug use: No     Allergies   Shellfish-derived products, Iodine, and  Morphine and related   Review of Systems Review of Systems  Constitutional:  Positive for fever. Negative for activity change, appetite change, chills, diaphoresis and fatigue.  HENT:  Positive for congestion, ear pain, rhinorrhea, sinus pressure and sore throat. Negative for dental problem, drooling, ear discharge, facial swelling, hearing loss, mouth sores, nosebleeds, postnasal drip, sinus pain, sneezing, tinnitus, trouble swallowing and voice change.   Respiratory:  Positive for cough. Negative for apnea, choking, chest tightness, shortness of breath, wheezing and stridor.   Cardiovascular: Negative.   Gastrointestinal: Negative.   Musculoskeletal:  Positive for myalgias. Negative for arthralgias, back pain, gait problem, joint swelling, neck pain and neck stiffness.  Skin: Negative.   Neurological:  Positive for headaches. Negative for dizziness, tremors, seizures, syncope, facial asymmetry, speech difficulty, weakness, light-headedness and numbness.     Physical Exam Triage Vital Signs ED Triage Vitals  Enc Vitals Group     BP 03/27/22 0909 121/81     Pulse Rate 03/27/22 0909 99     Resp 03/27/22 0909 12     Temp --      Temp src --      SpO2 03/27/22 0909 98 %     Weight 03/27/22 0907 125 lb (56.7 kg)     Height 03/27/22 0907 '5\' 2"'$  (1.575 m)     Head Circumference --      Peak Flow --      Pain Score 03/27/22 0907 6     Pain Loc --      Pain Edu? --      Excl. in [place]? --    No data found.  Updated Vital Signs BP 121/81 (BP Location: Right Arm)   Pulse 99   Resp 12   Ht '5\' 2"'$  (1.575 m)   Wt 125 lb (56.7 kg)   SpO2 98%   BMI 22.86 kg/m   Visual Acuity Right Eye Distance:   Left Eye Distance:   Bilateral Distance:    Right Eye Near:   Left Eye Near:    Bilateral Near:     Physical Exam Constitutional:      Appearance: Normal appearance. She is well-developed.  HENT:     Head: Normocephalic.     Right Ear: Tympanic membrane, ear canal and external ear  normal.     Left Ear: Tympanic membrane, ear canal and external ear normal.     Nose: Congestion and rhinorrhea present.     Mouth/Throat:     Mouth: Mucous membranes are moist.  Eyes:     Extraocular Movements: Extraocular movements intact.  Cardiovascular:     Rate  and Rhythm: Normal rate and regular rhythm.     Pulses: Normal pulses.     Heart sounds: Normal heart sounds.  Pulmonary:     Effort: Pulmonary effort is normal.     Breath sounds: Normal breath sounds.  Skin:    General: Skin is warm and dry.  Neurological:     Mental Status: She is alert and oriented to person, place, and time. Mental status is at baseline.  Psychiatric:        Mood and Affect: Mood normal.        Behavior: Behavior normal.      UC Treatments / Results  Labs (all labs ordered are listed, but only abnormal results are displayed) Labs Reviewed - No data to display  EKG   Radiology No results found.  Procedures Procedures (including critical care time)  Medications Ordered in UC Medications - No data to display  Initial Impression / Assessment and Plan / UC Course  I have reviewed the triage vital signs and the nursing notes.  Pertinent labs & imaging results that were available during my care of the patient were reviewed by me and considered in my medical decision making (see chart for details).  Viral URI with cough  Patient is in no signs of distress nor toxic appearing.  Vital signs are stable.  Low suspicion for pneumonia, pneumothorax or bronchitis and therefore will defer imaging.  COVID test is pending, reviewed quarantine guidelines per CDC recommendations, qualifies for antiviral if positive  Prescribed Flonase, and watchful wait antibiotic placed at pharmacy if no signs of improvement, advised continued use of Mucinex in addition as well as may use additional over-the-counter medications as needed for supportive care.  May follow-up with urgent care as needed if symptoms  persist or worsen.  Final Clinical Impressions(s) / UC Diagnoses   Final diagnoses:  None   Discharge Instructions   None    ED Prescriptions   None    PDMP not reviewed this encounter.   Hans Eden, [place] 03/27/22 [id number]

## 2022-04-13 ENCOUNTER — Other Ambulatory Visit: Payer: Self-pay | Admitting: Orthopedic Surgery

## 2022-04-13 ENCOUNTER — Other Ambulatory Visit (HOSPITAL_COMMUNITY): Payer: Self-pay | Admitting: Orthopedic Surgery

## 2022-04-13 DIAGNOSIS — M4726 Other spondylosis with radiculopathy, lumbar region: Secondary | ICD-10-CM

## 2022-04-13 DIAGNOSIS — M4316 Spondylolisthesis, lumbar region: Secondary | ICD-10-CM

## 2022-05-08 ENCOUNTER — Ambulatory Visit (HOSPITAL_COMMUNITY)
Admission: RE | Admit: 2022-05-08 | Discharge: 2022-05-08 | Disposition: A | Discharge status: Home / Self Care | Payer: Medicare Other | Source: Ambulatory Visit | Attending: Orthopedic Surgery | Admitting: Orthopedic Surgery

## 2022-05-08 DIAGNOSIS — M4316 Spondylolisthesis, lumbar region: Secondary | ICD-10-CM | POA: Insufficient documentation

## 2022-05-08 DIAGNOSIS — M4726 Other spondylosis with radiculopathy, lumbar region: Secondary | ICD-10-CM | POA: Insufficient documentation

## 2022-06-05 ENCOUNTER — Other Ambulatory Visit (HOSPITAL_COMMUNITY): Payer: Self-pay | Admitting: Orthopedic Surgery

## 2022-06-05 DIAGNOSIS — M1712 Unilateral primary osteoarthritis, left knee: Secondary | ICD-10-CM

## 2022-06-14 ENCOUNTER — Ambulatory Visit (HOSPITAL_COMMUNITY)
Admission: RE | Admit: 2022-06-14 | Discharge: 2022-06-14 | Disposition: A | Discharge status: Home / Self Care | Payer: Medicare Other | Source: Ambulatory Visit | Attending: Orthopedic Surgery | Admitting: Orthopedic Surgery

## 2022-06-14 DIAGNOSIS — M1712 Unilateral primary osteoarthritis, left knee: Secondary | ICD-10-CM | POA: Insufficient documentation

## 2022-08-22 ENCOUNTER — Ambulatory Visit
Admission: EM | Admit: 2022-08-22 | Discharge: 2022-08-22 | Disposition: A | Discharge status: Home / Self Care | Payer: 59 | Attending: Urgent Care | Admitting: Urgent Care

## 2022-08-22 DIAGNOSIS — J0100 Acute maxillary sinusitis, unspecified: Secondary | ICD-10-CM

## 2022-08-22 MED ORDER — FLUTICASONE PROPIONATE 50 MCG/ACT NA SUSP: 1.0000 | Freq: Two times a day (BID) | NASAL | 0 refills | Status: DC

## 2022-08-22 MED ORDER — AMOXICILLIN-POT CLAVULANATE 875-125 MG PO TABS: 1.0000 | ORAL_TABLET | Freq: Two times a day (BID) | ORAL | 0 refills | Status: DC

## 2022-08-22 NOTE — ED Provider Notes (Signed)
[facility]    CSN: [id number] Arrival date & time: 08/22/22  6734      History   Chief Complaint Chief Complaint  Patient presents with   Nasal Congestion   Facial Pain    HPI Melissa Jones is a 67 y.o. female.   Pleasant 67 year old female presents today due to concerns of facial pain in her maxillary sinuses bilaterally.  She states her nose feels very congested and is having postnasal drainage.  She reports the need to clear her throat due to mucus production and phlegm.  She also reports a pressure in her ears.  She states her taste and smell are both still there, but reports altered to both.  She reports symptoms have been present for greater than 10 days, she states that at the presentation of symptoms she had a fever.  She states this resolved with over-the-counter medication.  She states she has taken a full bottle of NyQuil.  She denies any photophobia or nuchal rigidity.  She denies a cough, shortness of breath or chest pain.  She does have a history of sinus issues in the past.  She has been using saline spray.     Past Medical History:  Diagnosis Date   Abnormal Pap smear of vagina    Chronic back pain    Depression    Takes Zoloft occas., no regular schedule   Hyperlipidemia    Hypertension     Patient Active Problem List   Diagnosis Date Noted   BACK PAIN 10/11/2010   CARPAL TUNNEL SYNDROME, BILATERAL 10/08/2009   SHOULDER PAIN, LEFT 02/18/2009   ABDOMINAL PAIN, LEFT LOWER QUADRANT 11/04/2008   DEPRESSION, ACUTE, RECURRENT 08/16/2008   MYALGIA 08/04/2008   PRIMARY HYPERPARATHYROIDISM 04/14/2008   HYPERCALCEMIA 03/18/2008   Lumbago 03/18/2008   HEADACHE, REBOUND 12/04/2007   Screening for cervical cancer 08/09/2007   HYPERTENSION 07/23/2007   CHEST PAIN, ATYPICAL 07/23/2007   HYPERLIPIDEMIA 07/19/2007   ESSENTIAL HYPERTENSION, BENIGN 07/19/2007   ASCUS (atypical squamous cells of undetermined significance) on Pap smear 07/16/2007     Past Surgical History:  Procedure Laterality Date   COLPOSCOPY     THYROID CYST EXCISION     TUBAL LIGATION      OB History     Gravida  5   Para  4   Term  4   Preterm  0   AB  1   Living  4      SAB  0   IAB  1   Ectopic  0   Multiple  0   Live Births               Home Medications    Prior to Admission medications   Medication Sig Start Date End Date Taking? Authorizing Provider  amoxicillin-clavulanate (AUGMENTIN) 875-125 MG tablet Take 1 tablet by mouth 2 (two) times daily with a meal for 10 days. 08/22/22 09/01/22 Yes Quisha Mabie L, PA  fluticasone (FLONASE) 50 MCG/ACT nasal spray Place 1 spray into both nostrils in the morning and at bedtime. 08/22/22  Yes Ranell Skibinski L, PA  acetaminophen (TYLENOL) 500 MG tablet Take 1,000 mg by mouth every 6 (six) hours as needed. For pain    [provider]  Calcium Carb-Cholecalciferol 600-800 MG-UNIT CHEW Chew 1 tablet by mouth 2 (two) times daily. 12/13/17   [provider]  clopidogrel (PLAVIX) 75 MG tablet Take 75 mg by mouth daily.    [provider]  cyclobenzaprine (FLEXERIL)  10 MG tablet Take 10 mg by mouth 3 (three) times daily as needed for muscle spasms.  Patient not taking: Reported on 11/09/2021    [provider]  gabapentin (NEURONTIN) 300 MG capsule Take 1 capsule (300 mg total) by mouth 2 (two) times daily. Patient taking differently: Take 300 mg by mouth at bedtime as needed (nerve pain). 05/21/16   Horton, Barbette Hair, MD  hydrocortisone 2.5 % cream Apply 1 application. topically 2 (two) times daily as needed for rash or itching. 09/30/21   [provider]  lisinopril (PRINIVIL,ZESTRIL) 10 MG tablet Take 10 mg by mouth daily.    [provider]  meloxicam (MOBIC) 15 MG tablet Take 15 mg by mouth daily. 06/08/16   [provider]  omeprazole (PRILOSEC) 40 MG capsule Take 40 mg by mouth daily before breakfast. Patient not taking:  Reported on 11/09/2021    [provider]  ondansetron (ZOFRAN-ODT) 4 MG disintegrating tablet Take 4 mg by mouth every 8 (eight) hours as needed for nausea or vomiting.    [provider]  rosuvastatin (CRESTOR) 20 MG tablet Take 20 mg by mouth at bedtime. 05/05/21   [provider]    Family History Family History  Problem Relation Age of Onset   Sinusitis Mother    Hypertension Mother    Hypertension Father    Depression Sister    Depression Sister     Social History Social History   Tobacco Use   Smoking status: Never   Smokeless tobacco: Never  Vaping Use   Vaping Use: Never used  Substance Use Topics   Alcohol use: No   Drug use: No     Allergies   Shellfish-derived products, Iodine, and Morphine and related   Review of Systems Review of Systems As per HPI  Physical Exam Triage Vital Signs ED Triage Vitals  Enc Vitals Group     BP 08/22/22 0932 136/80     Pulse Rate 08/22/22 0932 86     Resp 08/22/22 0932 17     Temp 08/22/22 0932 98.4 F (36.9 C)     Temp Source 08/22/22 0932 Oral     SpO2 08/22/22 0932 96 %     Weight --      Height --      Head Circumference --      Peak Flow --      Pain Score 08/22/22 0934 3     Pain Loc --      Pain Edu? --      Excl. in [place]? --    No data found.  Updated Vital Signs BP 136/80 (BP Location: Left Arm)   Pulse 86   Temp 98.4 F (36.9 C) (Oral)   Resp 17   SpO2 96%   Visual Acuity Right Eye Distance:   Left Eye Distance:   Bilateral Distance:    Right Eye Near:   Left Eye Near:    Bilateral Near:     Physical Exam Vitals and nursing note reviewed.  Constitutional:      General: She is not in acute distress.    Appearance: Normal appearance. She is normal weight. She is not ill-appearing, toxic-appearing or diaphoretic.  HENT:     Head: Normocephalic and atraumatic.     Right Ear: Ear canal and external ear normal. A middle ear effusion is present. There is no impacted  cerumen. Tympanic membrane is retracted. Tympanic membrane has normal mobility.     Left Ear:  Ear canal and external ear normal. A middle ear effusion is present. There is no impacted cerumen. Tympanic membrane is retracted. Tympanic membrane has normal mobility.     Nose: Congestion and rhinorrhea present.     Right Turbinates: Enlarged.     Left Turbinates: Enlarged.     Right Sinus: Maxillary sinus tenderness and frontal sinus tenderness present.     Left Sinus: Maxillary sinus tenderness and frontal sinus tenderness present.     Mouth/Throat:     Mouth: Mucous membranes are moist.     Pharynx: Oropharynx is clear. No oropharyngeal exudate or posterior oropharyngeal erythema.  Eyes:     General: No scleral icterus.       Right eye: No discharge.        Left eye: No discharge.     Extraocular Movements: Extraocular movements intact.     Conjunctiva/sclera: Conjunctivae normal.     Pupils: Pupils are equal, round, and reactive to light.  Cardiovascular:     Rate and Rhythm: Normal rate and regular rhythm.     Pulses: Normal pulses.     Heart sounds: No murmur heard. Pulmonary:     Effort: Pulmonary effort is normal. No respiratory distress.     Breath sounds: Normal breath sounds. No stridor. No wheezing, rhonchi or rales.  Chest:     Chest wall: No tenderness.  Musculoskeletal:     Cervical back: Normal range of motion and neck supple. No rigidity or tenderness.  Lymphadenopathy:     Cervical: No cervical adenopathy.  Neurological:     Mental Status: She is alert.      UC Treatments / Results  Labs (all labs ordered are listed, but only abnormal results are displayed) Labs Reviewed - No data to display  EKG   Radiology No results found.  Procedures Procedures (including critical care time)  Medications Ordered in UC Medications - No data to display  Initial Impression / Assessment and Plan / UC Course  I have reviewed the triage vital signs and the nursing  notes.  Pertinent labs & imaging results that were available during my care of the patient were reviewed by me and considered in my medical decision making (see chart for details).     Acute sinusitis -patient's symptoms have been greater than 10 days, does have a history of sinusitis in the past.  Has failed supportive measures at this point therefore must add antibiotic therapy.  Will also add Flonase.  Recommended continuing nasal saline and humidification.  Return to clinic precautions discussed.   Final Clinical Impressions(s) / UC Diagnoses   Final diagnoses:  Acute non-recurrent maxillary sinusitis     Discharge Instructions      You have a sinus infection. Please start taking the antibiotic, Augmentin, twice daily with food. Take it for all 10 days, do not stop early just because you feel better. Take an over the counter probiotic or yogurt daily to help prevent diarrhea/ yeast infection.  Use Flonase daily to help with inflammation of the nasal passage. It is also recommended that you use nasal saline/ sinus washes to cleans the sinus passages.  Hot steam from a shower or vaporizer may also be beneficial to help open up the upper airway. Eucalyptus can be helpful.  If any worsening symptoms such as headache, fever, or shortness of breath, please return for recheck.     ED Prescriptions     Medication Sig Dispense Auth. Provider   amoxicillin-clavulanate (AUGMENTIN) 875-125 MG tablet  Take 1 tablet by mouth 2 (two) times daily with a meal for 10 days. 20 tablet Mariadejesus Cade L, PA   fluticasone (FLONASE) 50 MCG/ACT nasal spray Place 1 spray into both nostrils in the morning and at bedtime. 16 mL Lavone Barrientes L, PA      PDMP not reviewed this encounter.   Chaney Malling, [place] 08/22/22 1001

## 2022-08-22 NOTE — ED Triage Notes (Addendum)
Pt c/o sinus congestion/facial pain x 10 days. Some ear pressure. Had fever last week but has since resolved. Taking tylenol and nyquil prn.

## 2022-08-22 NOTE — Discharge Instructions (Signed)
You have a sinus infection. Please start taking the antibiotic, Augmentin, twice daily with food. Take it for all 10 days, do not stop early just because you feel better. Take an over the counter probiotic or yogurt daily to help prevent diarrhea/ yeast infection.  Use Flonase daily to help with inflammation of the nasal passage. It is also recommended that you use nasal saline/ sinus washes to cleans the sinus passages.  Hot steam from a shower or vaporizer may also be beneficial to help open up the upper airway. Eucalyptus can be helpful.  If any worsening symptoms such as headache, fever, or shortness of breath, please return for recheck.

## 2022-08-24 ENCOUNTER — Ambulatory Visit
Admission: EM | Admit: 2022-08-24 | Discharge: 2022-08-24 | Disposition: A | Discharge status: Home / Self Care | Payer: 59 | Attending: Family Medicine | Admitting: Family Medicine

## 2022-08-24 DIAGNOSIS — T50905A Adverse effect of unspecified drugs, medicaments and biological substances, initial encounter: Secondary | ICD-10-CM | POA: Diagnosis not present

## 2022-08-24 DIAGNOSIS — Z8679 Personal history of other diseases of the circulatory system: Secondary | ICD-10-CM

## 2022-08-24 DIAGNOSIS — Z8616 Personal history of COVID-19: Secondary | ICD-10-CM

## 2022-08-24 DIAGNOSIS — K521 Toxic gastroenteritis and colitis: Secondary | ICD-10-CM

## 2022-08-24 DIAGNOSIS — T3695XA Adverse effect of unspecified systemic antibiotic, initial encounter: Secondary | ICD-10-CM

## 2022-08-24 MED ORDER — LOPERAMIDE HCL 2 MG PO CAPS: 2.0000 mg | ORAL_CAPSULE | Freq: Four times a day (QID) | ORAL | 0 refills | Status: DC | PRN

## 2022-08-24 MED ORDER — CEFUROXIME AXETIL 500 MG PO TABS: 500.0000 mg | ORAL_TABLET | Freq: Two times a day (BID) | ORAL | 0 refills | Status: DC

## 2022-08-24 NOTE — ED Triage Notes (Signed)
Pt c/o diarrhea since yesterday. More than 10 episodes of diarrhea. Some blood visible. Saw [facility] two days ago, dx with sinusitis and rx'd amoxicillin and fluticasone. She thinks the nasal spray is causing palpitations. Not currently having them now.

## 2022-08-24 NOTE — ED Provider Notes (Signed)
[facility]    CSN: [id number] Arrival date & time: 08/24/22  0817      History   Chief Complaint Chief Complaint  Patient presents with   Diarrhea    HPI Melissa Jones is a 67 y.o. female.   HPI  Patient has a long history of blood in stool an intermittent diarrhea.  She was scheduled for a colonoscopy early Jan and didn't tolerate the prep, so cancelled.  She is re scheduled next month.  She had Covid a couple of weeks ago, and  the Sx persisted so she came here and was diagnosed with sinusitis , treated with Augmentin.  She was also instructed to use the Flonase.  She states the Augmentin has caused severe diarrhea with blood,  and that each time she tries to use the Flonase she feels like she cannot breathe and has irregular heartbeats.  Is worried about her heart.  Didn't sleep last night.    Past Medical History:  Diagnosis Date   Abnormal Pap smear of vagina    Chronic back pain    Depression    Takes Zoloft occas., no regular schedule   Hyperlipidemia    Hypertension     Patient Active Problem List   Diagnosis Date Noted   BACK PAIN 10/11/2010   CARPAL TUNNEL SYNDROME, BILATERAL 10/08/2009   SHOULDER PAIN, LEFT 02/18/2009   ABDOMINAL PAIN, LEFT LOWER QUADRANT 11/04/2008   DEPRESSION, ACUTE, RECURRENT 08/16/2008   MYALGIA 08/04/2008   PRIMARY HYPERPARATHYROIDISM 04/14/2008   HYPERCALCEMIA 03/18/2008   Lumbago 03/18/2008   HEADACHE, REBOUND 12/04/2007   Screening for cervical cancer 08/09/2007   HYPERTENSION 07/23/2007   CHEST PAIN, ATYPICAL 07/23/2007   HYPERLIPIDEMIA 07/19/2007   ESSENTIAL HYPERTENSION, BENIGN 07/19/2007   ASCUS (atypical squamous cells of undetermined significance) on Pap smear 07/16/2007    Past Surgical History:  Procedure Laterality Date   COLPOSCOPY     THYROID CYST EXCISION     TUBAL LIGATION      OB History     Gravida  5   Para  4   Term  4   Preterm  0   AB  1   Living  4      SAB  0   IAB   1   Ectopic  0   Multiple  0   Live Births               Home Medications    Prior to Admission medications   Medication Sig Start Date End Date Taking? Authorizing Provider  cefUROXime (CEFTIN) 500 MG tablet Take 1 tablet (500 mg total) by mouth 2 (two) times daily with a meal. 08/24/22  Yes Raylene Everts, MD  loperamide (IMODIUM) 2 MG capsule Take 1 capsule (2 mg total) by mouth 4 (four) times daily as needed for diarrhea or loose stools. 08/24/22  Yes Raylene Everts, MD  acetaminophen (TYLENOL) 500 MG tablet Take 1,000 mg by mouth every 6 (six) hours as needed. For pain    [provider]  Calcium Carb-Cholecalciferol 600-800 MG-UNIT CHEW Chew 1 tablet by mouth 2 (two) times daily. 12/13/17   [provider]  fluticasone (FLONASE) 50 MCG/ACT nasal spray Place 1 spray into both nostrils in the morning and at bedtime. 08/22/22   Crain, Whitney L, PA  gabapentin (NEURONTIN) 300 MG capsule Take 1 capsule (300 mg total) by mouth 2 (two) times daily. Patient taking differently: Take 300 mg by mouth at bedtime as  needed (nerve pain). 05/21/16   Horton, Barbette Hair, MD  hydrocortisone 2.5 % cream Apply 1 application. topically 2 (two) times daily as needed for rash or itching. 09/30/21   [provider]  lisinopril (PRINIVIL,ZESTRIL) 10 MG tablet Take 10 mg by mouth daily.    [provider]  meloxicam (MOBIC) 15 MG tablet Take 15 mg by mouth daily. 06/08/16   [provider]  ondansetron (ZOFRAN-ODT) 4 MG disintegrating tablet Take 4 mg by mouth every 8 (eight) hours as needed for nausea or vomiting.    [provider]  rosuvastatin (CRESTOR) 20 MG tablet Take 20 mg by mouth at bedtime. 05/05/21   [provider]    Family History Family History  Problem Relation Age of Onset   Sinusitis Mother    Hypertension Mother    Hypertension Father    Depression Sister    Depression Sister     Social History Social History    Tobacco Use   Smoking status: Never   Smokeless tobacco: Never  Vaping Use   Vaping Use: Never used  Substance Use Topics   Alcohol use: No   Drug use: No     Allergies   Shellfish-derived products, Iodine, Morphine and related, Augmentin [amoxicillin-pot clavulanate], and Flonase [fluticasone]   Review of Systems Review of Systems  See HPI Physical Exam Triage Vital Signs ED Triage Vitals  Enc Vitals Group     BP 08/24/22 0831 (!) 151/89     Pulse Rate 08/24/22 0831 80     Resp 08/24/22 0831 17     Temp 08/24/22 0831 98.4 F (36.9 C)     Temp Source 08/24/22 0831 Oral     SpO2 08/24/22 0831 97 %     Weight --      Height --      Head Circumference --      Peak Flow --      Pain Score 08/24/22 0832 0     Pain Loc --      Pain Edu? --      Excl. in [place]? --    No data found.  Updated Vital Signs BP (!) 151/89 (BP Location: Right Arm)   Pulse 80   Temp 98.4 F (36.9 C) (Oral)   Resp 17   SpO2 97%       Physical Exam   UC Treatments / Results  Labs (all labs ordered are listed, but only abnormal results are displayed) Labs Reviewed - No data to display  EKG   Radiology No results found.  Procedures Procedures (including critical care time)  Medications Ordered in UC Medications - No data to display  Initial Impression / Assessment and Plan / UC Course  I have reviewed the triage vital signs and the nursing notes.  Pertinent labs & imaging results that were available during my care of the patient were reviewed by me and considered in my medical decision making (see chart for details).     Vital signs are stable.  Abdominal exam is benign.  EKG is normal.  I have reassured patient that even though she is not tolerating these medications they are doing no harm.  I do not believe the bloody diarrhea is C. difficile from the Augmentin, rather an exacerbation of her chronic bloody diarrhea from the antibiotic., Augmentin can cause diarrhea.  Will  treat conservatively.   Final Clinical Impressions(s) / UC Diagnoses   Final diagnoses:  Antibiotic-associated diarrhea  History of irregular heartbeat  History of COVID-19  Medication reaction, initial encounter     Discharge Instructions      Stop the augmentin and the flonase Take the ceftin 2 x a day starting tomorrow Drink more water Take the loperamide if needed for diarrhea See your doctor if not improving by next week     ED Prescriptions     Medication Sig Dispense Auth. Provider   loperamide (IMODIUM) 2 MG capsule Take 1 capsule (2 mg total) by mouth 4 (four) times daily as needed for diarrhea or loose stools. 12 capsule Raylene Everts, MD   cefUROXime (CEFTIN) 500 MG tablet Take 1 tablet (500 mg total) by mouth 2 (two) times daily with a meal. 10 tablet Raylene Everts, MD      PDMP not reviewed this encounter.   Raylene Everts, MD 08/24/22 [phone]

## 2022-08-24 NOTE — Discharge Instructions (Addendum)
Stop the augmentin and the flonase Take the ceftin 2 x a day starting tomorrow Drink more water Take the loperamide if needed for diarrhea See your doctor if not improving by next week

## 2022-08-30 ENCOUNTER — Emergency Department (HOSPITAL_COMMUNITY)
Admission: EM | Admit: 2022-08-30 | Discharge: 2022-08-30 | Disposition: A | Discharge status: Home / Self Care | Payer: 59 | Attending: Emergency Medicine | Admitting: Emergency Medicine

## 2022-08-30 ENCOUNTER — Other Ambulatory Visit: Payer: Self-pay

## 2022-08-30 ENCOUNTER — Emergency Department (HOSPITAL_COMMUNITY): Payer: 59

## 2022-08-30 DIAGNOSIS — U071 COVID-19: Secondary | ICD-10-CM

## 2022-08-30 DIAGNOSIS — R0602 Shortness of breath: Secondary | ICD-10-CM | POA: Diagnosis not present

## 2022-08-30 DIAGNOSIS — Z79899 Other long term (current) drug therapy: Secondary | ICD-10-CM | POA: Diagnosis not present

## 2022-08-30 DIAGNOSIS — R059 Cough, unspecified: Secondary | ICD-10-CM | POA: Diagnosis present

## 2022-08-30 DIAGNOSIS — R0981 Nasal congestion: Secondary | ICD-10-CM

## 2022-08-30 LAB — COMPREHENSIVE METABOLIC PANEL
ALT: 16 U/L (ref 0–44)
AST: 17 U/L (ref 15–41)
Albumin: 3.8 g/dL (ref 3.5–5.0)
Alkaline Phosphatase: 31 U/L — ABNORMAL LOW (ref 38–126)
Anion gap: 9 (ref 5–15)
BUN: 11 mg/dL (ref 8–23)
CO2: 26 mmol/L (ref 22–32)
Calcium: 9.1 mg/dL (ref 8.9–10.3)
Chloride: 104 mmol/L (ref 98–111)
Creatinine, Ser: 0.72 mg/dL (ref 0.44–1.00)
GFR, Estimated: >60 mL/min (ref >60)
Glucose, Bld: 92 mg/dL (ref 70–99)
Potassium: 3.8 mmol/L (ref 3.5–5.1)
Sodium: 139 mmol/L (ref 135–145)
Total Bilirubin: 0.7 mg/dL (ref 0.3–1.2)
Total Protein: 7.1 g/dL (ref 6.5–8.1)

## 2022-08-30 LAB — CBC WITH DIFFERENTIAL/PLATELET
Abs Immature Granulocytes: 0.01 10*3/uL (ref 0.00–0.07)
Basophils Absolute: 0 10*3/uL (ref 0.0–0.1)
Basophils Relative: 0 %
Eosinophils Absolute: 0 10*3/uL (ref 0.0–0.5)
Eosinophils Relative: 0 %
HCT: 37 % (ref 36.0–46.0)
Hemoglobin: 12.2 g/dL (ref 12.0–15.0)
Immature Granulocytes: 0 %
Lymphocytes Relative: 36 %
Lymphs Abs: 1.6 10*3/uL (ref 0.7–4.0)
MCH: 29.5 pg (ref 26.0–34.0)
MCHC: 33 g/dL (ref 30.0–36.0)
MCV: 89.4 fL (ref 80.0–100.0)
Monocytes Absolute: 0.3 10*3/uL (ref 0.1–1.0)
Monocytes Relative: 7 %
Neutro Abs: 2.5 10*3/uL (ref 1.7–7.7)
Neutrophils Relative %: 57 %
Platelets: 245 10*3/uL (ref 150–400)
RBC: 4.14 MIL/uL (ref 3.87–5.11)
RDW: 14.3 % (ref 11.5–15.5)
WBC: 4.5 10*3/uL (ref 4.0–10.5)
nRBC: 0 % (ref 0.0–0.2)

## 2022-08-30 LAB — BRAIN NATRIURETIC PEPTIDE: B Natriuretic Peptide: 21.5 pg/mL (ref 0.0–100.0)

## 2022-08-30 LAB — D-DIMER, QUANTITATIVE: D-Dimer, Quant: <0.27 ug/mL-FEU (ref 0.00–0.50)

## 2022-08-30 LAB — TROPONIN I (HIGH SENSITIVITY): Troponin I (High Sensitivity): <2 ng/L (ref <18)

## 2022-08-30 MED ORDER — OXYMETAZOLINE HCL 0.05 % NA SOLN
1.0000 | Freq: Once | NASAL | Status: AC
  Administered 2022-08-30: 1 via NASAL
  Filled 2022-08-30: qty 30

## 2022-08-30 MED ORDER — PREDNISONE 20 MG PO TABS
60.0000 mg | ORAL_TABLET | ORAL | Status: AC
  Administered 2022-08-30: 60 mg via ORAL
  Filled 2022-08-30: qty 3

## 2022-08-30 NOTE — ED Notes (Signed)
Pt complains of chest tightness

## 2022-08-30 NOTE — ED Provider Notes (Signed)
[facility] Provider Note   CSN: [id number] Arrival date & time: 08/30/22  0750     History  Chief Complaint  Patient presents with   Shortness of Breath   Recurrent Sinusitis   Generalized Body Aches   Cough    Melissa Jones is a 67 y.o. female.  HPI Patient with concern of ongoing sinus congestion, myalgia, cough.  History is notable for diagnosis of COVID recently, and symptoms began about 2 weeks ago. With ongoing sinus congestion patient has seen her physician, tried 2 days of Flonase, stopped due to perceived intolerance.  She completed a course of antibiotics that was actually her second emergent, last week.  No persistent chest pain, mild cough and dyspnea.  No fever, no vomiting.    Home Medications Prior to Admission medications   Medication Sig Start Date End Date Taking? Authorizing Provider  acetaminophen (TYLENOL) 500 MG tablet Take 1,000 mg by mouth every 6 (six) hours as needed. For pain    [provider]  Calcium Carb-Cholecalciferol 600-800 MG-UNIT CHEW Chew 1 tablet by mouth 2 (two) times daily. 12/13/17   [provider]  cefUROXime (CEFTIN) 500 MG tablet Take 1 tablet (500 mg total) by mouth 2 (two) times daily with a meal. 08/24/22   Raylene Everts, MD  fluticasone [facility]) 50 MCG/ACT nasal spray Place 1 spray into both nostrils in the morning and at bedtime. 08/22/22   Crain, Whitney L, PA  gabapentin (NEURONTIN) 300 MG capsule Take 1 capsule (300 mg total) by mouth 2 (two) times daily. Patient taking differently: Take 300 mg by mouth at bedtime as needed (nerve pain). 05/21/16   Horton, Barbette Hair, MD  hydrocortisone 2.5 % cream Apply 1 application. topically 2 (two) times daily as needed for rash or itching. 09/30/21   [provider]  lisinopril (PRINIVIL,ZESTRIL) 10 MG tablet Take 10 mg by mouth daily.    [provider]  loperamide (IMODIUM) 2 MG capsule Take 1 capsule  (2 mg total) by mouth 4 (four) times daily as needed for diarrhea or loose stools. 08/24/22   Raylene Everts, MD  meloxicam (MOBIC) 15 MG tablet Take 15 mg by mouth daily. 06/08/16   [provider]  ondansetron (ZOFRAN-ODT) 4 MG disintegrating tablet Take 4 mg by mouth every 8 (eight) hours as needed for nausea or vomiting.    [provider]  rosuvastatin (CRESTOR) 20 MG tablet Take 20 mg by mouth at bedtime. 05/05/21   [provider]      Allergies    Shellfish-derived products, Iodine, Morphine and related, Augmentin [amoxicillin-pot clavulanate], and Flonase [fluticasone]    Review of Systems   Review of Systems  All other systems reviewed and are negative.   Physical Exam Updated Vital Signs BP 138/76   Pulse 70   Temp 99 F (37.2 C) (Oral)   Resp 17   SpO2 100%  Physical Exam Vitals and nursing note reviewed.  Constitutional:      General: She is not in acute distress.    Appearance: She is well-developed.  HENT:     Head: Normocephalic and atraumatic.  Eyes:     Conjunctiva/sclera: Conjunctivae normal.  Cardiovascular:     Rate and Rhythm: Normal rate and regular rhythm.  Pulmonary:     Effort: Pulmonary effort is normal. No respiratory distress.     Breath sounds: Normal breath sounds. No stridor.  Abdominal:     General: There is  no distension.  Skin:    General: Skin is warm and dry.  Neurological:     Mental Status: She is alert and oriented to person, place, and time.     Cranial Nerves: No cranial nerve deficit.  Psychiatric:        Mood and Affect: Mood normal.     ED Results / Procedures / Treatments   Labs (all labs ordered are listed, but only abnormal results are displayed) Labs Reviewed  COMPREHENSIVE METABOLIC PANEL - Abnormal; Notable for the following components:      Result Value   Alkaline Phosphatase 31 (*)    All other components within normal limits  CBC WITH DIFFERENTIAL/PLATELET  D-DIMER, QUANTITATIVE   BRAIN NATRIURETIC PEPTIDE  TROPONIN I (HIGH SENSITIVITY)    EKG EKG Interpretation  Date/Time:  Wednesday August 30 2022 09:25:58 EST Ventricular Rate:  64 PR Interval:  138 QRS Duration: 96 QT Interval:  409 QTC Calculation: 422 R Axis:   63 Text Interpretation: Sinus rhythm Baseline wander Otherwise within normal limits Confirmed by Carmin Muskrat [phone]) on 08/30/2022 9:59:44 AM  Radiology DG Chest 2 View  Result Date: 08/30/2022 CLINICAL DATA:  Shortness of breath, body aches, upper chest pain and cough x1 week. Concern for pneumonia. EXAM: CHEST - 2 VIEW COMPARISON:  Chest radiograph April 28, 2020. FINDINGS: The heart size and mediastinal contours are within normal limits. No focal consolidation. No pleural effusion. No pneumothorax. The visualized skeletal structures are unremarkable. IMPRESSION: No acute cardiopulmonary disease. Electronically Signed   By: Dahlia Bailiff M.D.   On: 08/30/2022 09:25    Procedures Procedures    Medications Ordered in ED Medications  oxymetazoline (AFRIN) 0.05 % nasal spray 1 spray (1 spray Each Nare Given 08/30/22 1203)  predniSONE (DELTASONE) tablet 60 mg (60 mg Oral Given 08/30/22 1201)    ED Course/ Medical Decision Making/ A&P                             Medical Decision Making Patient with history of hypertension, recent COVID diagnosis presents with systemic complaints including myalgia, rhinorrhea, fatigue.  Here she is awake, alert, afebrile.  No evidence for bacteremia, sepsis on labs, no evidence for pneumonia on x-ray, D-dimer is normal, and low suspicion for cardiac or cardiothoracic acute changes. Given her seemingly primary complaint of rhinorrhea, fatigue she was started on a short course of Afrin, steroids, will follow-up with primary care.  Amount and/or Complexity of Data Reviewed External Data Reviewed: notes. Labs: ordered. Decision-making details documented in ED Course. Radiology: ordered and independent  interpretation performed. Decision-making details documented in ED Course. ECG/medicine tests: ordered and independent interpretation performed. Decision-making details documented in ED Course.  Risk OTC drugs. Prescription drug management. Decision regarding hospitalization.         Final Clinical Impression(s) / ED Diagnoses Final diagnoses:  COVID  Sinus congestion    Rx / DC Orders ED Discharge Orders     None         Carmin Muskrat, MD 08/30/22 1232

## 2022-08-30 NOTE — Medical Student Note (Incomplete)
[facility] DEPT Provider Student Note For educational purposes for Medical, PA and NP students only and not part of the legal medical record.   CSN: [id number] Arrival date & time: 08/30/22  0750      History   Chief Complaint Chief Complaint  Patient presents with   Shortness of Breath   Recurrent Sinusitis   Generalized Body Aches   Cough    HPI Melissa Jones is a 67 y.o. female with pmh of hyperlipidemia, hypertension, chronic back pain, and previous sinus issues who was diagnosed with COVID on 08/16/22 and sinusitis on 08/22/22, now presenting for SOB. She was diagnosed with Covid on 08/16/22. The symptoms persisted so she came to ED on 08/22/22 and was diagnosed with sinusitis , treated with Augmentin. She was also instructed to use Flonase. She states the Augmentin caused nausea and severe diarrhea and that each time she tried to use the Flonase she felt ike she could not breathe and had irregular heartbeats. She was switched to Cefuroxime BID x 5 days, for which she completed on 08/29/22.  She took Loratidine and Nyquil without improvement.  She reports previous fevers. Today, she endorses SOB, bilateral maxillary sinus pain, nasal congestion, postnasal drip, palpitations, and ear pressure and pulsatile sensation. She also endorses nausea which was relieved with Zofran. She notes chest pressure that radiates to the upper back causing back pain. She denies pharyngitis, photophobia, nuchal rigidity, or body aches.    Past Medical History:  Diagnosis Date   Abnormal Pap smear of vagina    Chronic back pain    Depression    Takes Zoloft occas., no regular schedule   Hyperlipidemia    Hypertension     Patient Active Problem List   Diagnosis Date Noted   BACK PAIN 10/11/2010   CARPAL TUNNEL SYNDROME, BILATERAL 10/08/2009   SHOULDER PAIN, LEFT 02/18/2009   ABDOMINAL PAIN, LEFT LOWER QUADRANT 11/04/2008   DEPRESSION, ACUTE, RECURRENT 08/16/2008   MYALGIA 08/04/2008    PRIMARY HYPERPARATHYROIDISM 04/14/2008   HYPERCALCEMIA 03/18/2008   Lumbago 03/18/2008   HEADACHE, REBOUND 12/04/2007   Screening for cervical cancer 08/09/2007   HYPERTENSION 07/23/2007   CHEST PAIN, ATYPICAL 07/23/2007   HYPERLIPIDEMIA 07/19/2007   ESSENTIAL HYPERTENSION, BENIGN 07/19/2007   ASCUS (atypical squamous cells of undetermined significance) on Pap smear 07/16/2007    Past Surgical History:  Procedure Laterality Date   COLPOSCOPY     THYROID CYST EXCISION     TUBAL LIGATION      OB History     Gravida  5   Para  4   Term  4   Preterm  0   AB  1   Living  4      SAB  0   IAB  1   Ectopic  0   Multiple  0   Live Births               Home Medications    Prior to Admission medications   Medication Sig Start Date End Date Taking? Authorizing Provider  acetaminophen (TYLENOL) 500 MG tablet Take 1,000 mg by mouth every 6 (six) hours as needed. For pain    [provider]  Calcium Carb-Cholecalciferol 600-800 MG-UNIT CHEW Chew 1 tablet by mouth 2 (two) times daily. 12/13/17   [provider]  cefUROXime (CEFTIN) 500 MG tablet Take 1 tablet (500 mg total) by mouth 2 (two) times daily with a meal. 08/24/22   Raylene Everts, MD  fluticasone [facility])  50 MCG/ACT nasal spray Place 1 spray into both nostrils in the morning and at bedtime. 08/22/22   Crain, Whitney L, PA  gabapentin (NEURONTIN) 300 MG capsule Take 1 capsule (300 mg total) by mouth 2 (two) times daily. Patient taking differently: Take 300 mg by mouth at bedtime as needed (nerve pain). 05/21/16   Horton, Barbette Hair, MD  hydrocortisone 2.5 % cream Apply 1 application. topically 2 (two) times daily as needed for rash or itching. 09/30/21   [provider]  lisinopril (PRINIVIL,ZESTRIL) 10 MG tablet Take 10 mg by mouth daily.    [provider]  loperamide (IMODIUM) 2 MG capsule Take 1 capsule (2 mg total) by mouth 4 (four) times daily as needed for diarrhea or  loose stools. 08/24/22   Raylene Everts, MD  meloxicam (MOBIC) 15 MG tablet Take 15 mg by mouth daily. 06/08/16   [provider]  ondansetron (ZOFRAN-ODT) 4 MG disintegrating tablet Take 4 mg by mouth every 8 (eight) hours as needed for nausea or vomiting.    [provider]  rosuvastatin (CRESTOR) 20 MG tablet Take 20 mg by mouth at bedtime. 05/05/21   [provider]    Family History Family History  Problem Relation Age of Onset   Sinusitis Mother    Hypertension Mother    Hypertension Father    Depression Sister    Depression Sister     Social History Social History   Tobacco Use   Smoking status: Never   Smokeless tobacco: Never  Vaping Use   Vaping Use: Never used  Substance Use Topics   Alcohol use: No   Drug use: No     Allergies   Shellfish-derived products, Iodine, Morphine and related, Augmentin [amoxicillin-pot clavulanate], and Flonase [fluticasone]   Review of Systems Review of Systems  Constitutional:  Positive for fatigue and fever.  HENT:  Positive for congestion, ear pain, rhinorrhea, sinus pressure and sinus pain. Negative for hearing loss, sore throat and tinnitus.   Eyes:  Negative for photophobia and visual disturbance.  Respiratory:  Positive for cough and shortness of breath. Negative for wheezing and stridor.   Cardiovascular:  Positive for chest pain and palpitations. Negative for leg swelling.  Gastrointestinal:  Positive for blood in stool, diarrhea and nausea. Negative for abdominal pain and vomiting.  Musculoskeletal:  Positive for back pain. Negative for neck pain.  Neurological:  Positive for headaches.     Physical Exam Updated Vital Signs BP (!) 136/90   Pulse 75   Temp 99.7 F (37.6 C) (Oral)   Resp 16   SpO2 100%   Physical Exam Constitutional:      Appearance: She is well-developed. She is ill-appearing.  HENT:     Head: Normocephalic and atraumatic.     Mouth/Throat:     Pharynx:  Oropharynx is clear.  Eyes:     Extraocular Movements: Extraocular movements intact.     Pupils: Pupils are equal, round, and reactive to light.  Cardiovascular:     Rate and Rhythm: Normal rate and regular rhythm.  Pulmonary:     Effort: Pulmonary effort is normal.     Breath sounds: Normal breath sounds.  Abdominal:     Palpations: Abdomen is soft.  Musculoskeletal:     Cervical back: Normal range of motion.     Right lower leg: No edema.     Left lower leg: No edema.  Skin:    General: Skin is warm and dry.  Neurological:  General: No focal deficit present.     Mental Status: She is alert.  Psychiatric:        Mood and Affect: Mood normal.      ED Treatments / Results  Labs (all labs ordered are listed, but only abnormal results are displayed) Labs Reviewed - No data to display  EKG  Radiology No results found.  Procedures Procedures (including critical care time)  Medications Ordered in ED Medications - No data to display   Initial Impression / Assessment and Plan / ED Course  I have reviewed the triage vital signs and the nursing notes.  Pertinent labs & imaging results that were available during my care of the patient were reviewed by me and considered in my medical decision making (see chart for details).   Melissa Jones is a 67 y.o. female with pmh of hyperlipidemia, hypertension, chronic back pain, and previous sinus issues who was diagnosed with COVID on 08/16/22 and sinusitis on 08/22/22, now presenting for SOB, bilateral maxillary sinus pressure, and congestion.  Ordered EKG, cardiac monitoring, BNP, Troponin I to rule out MI Ordered D-dimer to rule out PE. D-dimer < 0.27.  Ordered  2-view chest XR to rule out cardiopulmonary abnormalities. No acute cardiopulmonary disease.  Ordered CBC and CMP to evaluate for electrolyte abnormalities, signs of infection, and hemodynamic instability.CBC within normal limits. CMP within normal limits.    Likely  Final Clinical Impressions(s) / ED Diagnoses   Final diagnoses:  None    New Prescriptions New Prescriptions   No medications on file

## 2022-08-30 NOTE — Discharge Instructions (Signed)
As discussed, today's evaluation has been reassuring.  There is no evidence for pneumonia, blood clots, or problems with your heart.  Your symptoms are likely due to [place].    Please use the nasal inhaler you are provided twice daily tomorrow and on Friday.  Please obtain and take the oral steroids for the next 4 days.  Continue taking your loratadine, but in increased dose, 10 mg, in the morning and again in the evening.  Follow-up with your physician.   Como ya hemos dicho, la evaluacin de hoy ha sido tranquilizadora. No hay evidencia de neumona, cogulos sanguneos o problemas cardacos. Es probable que sus sntomas se deban al COVID.  Utilice el Risk analyst veces al da maana y el viernes.  [place] y tome los esteroides orales durante los prximos 4 das. Contine tomando su loratadina, pero en dosis mayores, 10 mg, por la maana y nuevamente por la noche.  Haga un seguimiento con su mdico.

## 2022-08-30 NOTE — ED Triage Notes (Signed)
Pt. Stated, Ive had sickness for over 2 weeks now and was taking an antibiotic and just finished it and and Im still sick with breathing and coughing. I called my dr. And he said to come here.

## 2023-03-08 ENCOUNTER — Ambulatory Visit: Payer: 59 | Admitting: Obstetrics and Gynecology

## 2023-06-06 ENCOUNTER — Telehealth: Payer: Self-pay | Admitting: Family Medicine

## 2023-06-06 NOTE — Telephone Encounter (Signed)
I have been in contact with this patient since June trying to get her scheduled she was in [place] for the summer, and then requested for me to call her when December calendar opens up. I reached out to the patient and received no answer. And no mailbox to leave a message.

## 2023-08-08 ENCOUNTER — Encounter: Payer: Self-pay | Admitting: Obstetrics and Gynecology

## 2023-08-08 ENCOUNTER — Ambulatory Visit: Payer: 59 | Admitting: Obstetrics and Gynecology

## 2023-08-08 ENCOUNTER — Other Ambulatory Visit (HOSPITAL_COMMUNITY)
Admission: RE | Admit: 2023-08-08 | Discharge: 2023-08-08 | Disposition: A | Discharge status: Home / Self Care | Payer: 59 | Source: Ambulatory Visit | Attending: Obstetrics and Gynecology | Admitting: Obstetrics and Gynecology

## 2023-08-08 VITALS — BP 128/77 | HR 78 | Ht 62.0 in | Wt 135.0 lb

## 2023-08-08 DIAGNOSIS — Z01419 Encounter for gynecological examination (general) (routine) without abnormal findings: Secondary | ICD-10-CM | POA: Diagnosis present

## 2023-08-08 DIAGNOSIS — N951 Menopausal and female climacteric states: Secondary | ICD-10-CM

## 2023-08-08 DIAGNOSIS — Z758 Other problems related to medical facilities and other health care: Secondary | ICD-10-CM

## 2023-08-08 LAB — HCV ANTIBODY: HCV Ab: NONREACTIVE
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
## 2023-08-10 LAB — CYTOLOGY - PAP
Comment: NEGATIVE
Diagnosis: NEGATIVE
High risk HPV: NEGATIVE

## 2023-09-22 ENCOUNTER — Ambulatory Visit
Admission: EM | Admit: 2023-09-22 | Discharge: 2023-09-22 | Disposition: A | Discharge status: Home / Self Care | Payer: 59 | Attending: Family Medicine | Admitting: Family Medicine

## 2023-09-22 DIAGNOSIS — H6692 Otitis media, unspecified, left ear: Secondary | ICD-10-CM

## 2023-09-22 DIAGNOSIS — H6123 Impacted cerumen, bilateral: Secondary | ICD-10-CM | POA: Diagnosis not present

## 2023-09-22 MED ORDER — AZITHROMYCIN 250 MG PO TABS: 250.0000 mg | ORAL_TABLET | Freq: Every day | ORAL | 0 refills | Status: AC

## 2023-09-22 NOTE — Discharge Instructions (Addendum)
Advised patient to take medication as directed with food to completion.  Encouraged increase daily water intake to 64 ounces per day while taking this medication.  Advised if symptoms worsen and/or unresolved please follow-up with PCP or here for further evaluation. 

## 2023-09-22 NOTE — ED Triage Notes (Signed)
 Pt c/o ear fullness x 1 week. Some intermittent dizziness.

## 2023-09-22 NOTE — ED Provider Notes (Signed)
 [facility]    CSN: [id number] Arrival date & time: 09/22/23  1251      History   Chief Complaint Chief Complaint  Patient presents with   Ear Fullness    HPI Melissa Jones is a 68 y.o. female.   HPI Very pleasant 68 year old female presents with ear fullness for 1 week with some intermittent dizziness.  PMH significant for chronic back pain, HTN, and HLD.  Past Medical History:  Diagnosis Date   Abnormal Pap smear of vagina    Chronic back pain    Depression    Takes Zoloft occas., no regular schedule   Hyperlipidemia    Hypertension     Patient Active Problem List   Diagnosis Date Noted   Backache 10/11/2010   CARPAL TUNNEL SYNDROME, BILATERAL 10/08/2009   SHOULDER PAIN, LEFT 02/18/2009   ABDOMINAL PAIN, LEFT LOWER QUADRANT 11/04/2008   Severe episode of recurrent major depressive disorder (HCC) 08/16/2008   MYALGIA 08/04/2008   PRIMARY HYPERPARATHYROIDISM 04/14/2008   HYPERCALCEMIA 03/18/2008   Lumbago 03/18/2008   Headache 12/04/2007   Screening for cervical cancer 08/09/2007   Essential hypertension 07/23/2007   CHEST PAIN, ATYPICAL 07/23/2007   HYPERLIPIDEMIA 07/19/2007   ESSENTIAL HYPERTENSION, BENIGN 07/19/2007   ASCUS (atypical squamous cells of undetermined significance) on Pap smear 07/16/2007    Past Surgical History:  Procedure Laterality Date   COLPOSCOPY     THYROID CYST EXCISION     TUBAL LIGATION      OB History     Gravida  5   Para  4   Term  4   Preterm  0   AB  1   Living  4      SAB  0   IAB  1   Ectopic  0   Multiple  0   Live Births               Home Medications    Prior to Admission medications   Medication Sig Start Date End Date Taking? Authorizing Provider  azithromycin (ZITHROMAX) 250 MG tablet Take 1 tablet (250 mg total) by mouth daily. Take first 2 tablets together, then 1 every day until finished. 09/22/23  Yes Trevor Iha, FNP  Calcium Carb-Cholecalciferol 600-800 MG-UNIT  CHEW Chew 1 tablet by mouth 2 (two) times daily. 12/13/17   [provider]  gabapentin (NEURONTIN) 300 MG capsule Take 1 capsule (300 mg total) by mouth 2 (two) times daily. Patient taking differently: Take 300 mg by mouth at bedtime as needed (nerve pain). 05/21/16   Horton, Mayer Masker, MD  lisinopril (PRINIVIL,ZESTRIL) 10 MG tablet Take 10 mg by mouth daily.    [provider]  meloxicam (MOBIC) 15 MG tablet Take 15 mg by mouth daily. Patient not taking: Reported on 08/08/2023 06/08/16   [provider]  rosuvastatin (CRESTOR) 20 MG tablet Take 20 mg by mouth at bedtime. 05/05/21   [provider]    Family History Family History  Problem Relation Age of Onset   Sinusitis Mother    Hypertension Mother    Hypertension Father    Depression Sister    Depression Sister     Social History Social History   Tobacco Use   Smoking status: Never   Smokeless tobacco: Never  Vaping Use   Vaping status: Never Used  Substance Use Topics   Alcohol use: No   Drug use: No     Allergies   Iodinated contrast media, Shellfish-derived products, Iodine,  Gadolinium derivatives, Augmentin [amoxicillin-pot clavulanate], Fluticasone, Morphine and codeine, and Other   Review of Systems Review of Systems  HENT:  Positive for ear pain.        Bilateral ear fullness for 1 week  Neurological:  Positive for dizziness.  All other systems reviewed and are negative.    Physical Exam Triage Vital Signs ED Triage Vitals  Encounter Vitals Group     BP 09/22/23 1325 113/73     Systolic BP Percentile --      Diastolic BP Percentile --      Pulse Rate 09/22/23 1325 83     Resp 09/22/23 1325 17     Temp 09/22/23 1325 98.4 F (36.9 C)     Temp Source 09/22/23 1325 Oral     SpO2 09/22/23 1325 98 %     Weight --      Height --      Head Circumference --      Peak Flow --      Pain Score 09/22/23 1326 1     Pain Loc --      Pain Education --      Exclude from  Growth Chart --    No data found.  Updated Vital Signs BP 113/73 (BP Location: Left Arm)   Pulse 83   Temp 98.4 F (36.9 C) (Oral)   Resp 17   SpO2 98%      Physical Exam Vitals and nursing note reviewed.  Constitutional:      General: She is not in acute distress.    Appearance: Normal appearance. She is normal weight. She is not ill-appearing.  HENT:     Head: Normocephalic and atraumatic.     Right Ear: External ear normal.     Left Ear: External ear normal.     Ears:     Comments: Bilateral EACs with excessive cerumen.  Post bilateral ear lavage: Left EAC-clear, Left TM-red rimmed, retracted; Right EAC-clear, mildly erythematous from previous cerumen accumulation, Right TM-clear, with good light reflex and mobility Cardiovascular:     Rate and Rhythm: Normal rate and regular rhythm.     Pulses: Normal pulses.     Heart sounds: Normal heart sounds.  Pulmonary:     Effort: Pulmonary effort is normal.     Breath sounds: Normal breath sounds. No wheezing, rhonchi or rales.  Musculoskeletal:        General: Normal range of motion.     Cervical back: Normal range of motion and neck supple.  Skin:    General: Skin is warm and dry.  Neurological:     General: No focal deficit present.     Mental Status: She is alert and oriented to person, place, and time. Mental status is at baseline.  Psychiatric:        Mood and Affect: Mood normal.        Behavior: Behavior normal.      UC Treatments / Results  Labs (all labs ordered are listed, but only abnormal results are displayed) Labs Reviewed - No data to display  EKG   Radiology No results found.  Procedures Procedures (including critical care time)  Medications Ordered in UC Medications - No data to display  Initial Impression / Assessment and Plan / UC Course  I have reviewed the triage vital signs and the nursing notes.  Pertinent labs & imaging results that were available during my care of the patient  were reviewed by me and considered in my  medical decision making (see chart for details).     MDM: 1.  Acute left otitis media-Rx Zithromax (500 mg day 1, then 250 mg day 2-5); 2.  Excessive cerumen in both ear canals-resolved with bilateral ear lavage. Advised patient to take medication as directed with food to completion.  Encouraged increase daily water intake to 64 ounces per day while taking this medication.  Advised if symptoms worsen and/or unresolved please follow-up with PCP or here for further evaluation.  Final Clinical Impressions(s) / UC Diagnoses   Final diagnoses:  Excessive cerumen in both ear canals  Acute left otitis media     Discharge Instructions      Advised patient to take medication as directed with food to completion.  Encouraged increase daily water intake to 64 ounces per day while taking this medication.  Advised if symptoms worsen and/or unresolved please follow-up with PCP or here for further evaluation.     ED Prescriptions     Medication Sig Dispense Auth. Provider   azithromycin (ZITHROMAX) 250 MG tablet Take 1 tablet (250 mg total) by mouth daily. Take first 2 tablets together, then 1 every day until finished. 6 tablet Trevor Iha, FNP      PDMP not reviewed this encounter.   Trevor Iha, FNP 09/22/23 1429

## 2024-02-23 LAB — COLOGUARD: COLOGUARD: NEGATIVE
